# Patient Record
Sex: Female | Born: 1975 | Race: Black or African American | Hispanic: No | Marital: Single | Smoking: Never smoker
Health system: Southern US, Community
[De-identification: ages and names within clinical notes are randomized; demographics above are authoritative.]

## PROBLEM LIST (undated history)

## (undated) DIAGNOSIS — E079 Disorder of thyroid, unspecified: Secondary | ICD-10-CM

## (undated) DIAGNOSIS — B259 Cytomegaloviral disease, unspecified: Secondary | ICD-10-CM

## (undated) DIAGNOSIS — I219 Acute myocardial infarction, unspecified: Secondary | ICD-10-CM

## (undated) DIAGNOSIS — I469 Cardiac arrest, cause unspecified: Secondary | ICD-10-CM

## (undated) DIAGNOSIS — D649 Anemia, unspecified: Secondary | ICD-10-CM

## (undated) DIAGNOSIS — D219 Benign neoplasm of connective and other soft tissue, unspecified: Secondary | ICD-10-CM

## (undated) HISTORY — DX: Benign neoplasm of connective and other soft tissue, unspecified: D21.9

## (undated) HISTORY — DX: Acute myocardial infarction, unspecified: I21.9

## (undated) HISTORY — DX: Cardiac arrest, cause unspecified: I46.9

## (undated) HISTORY — DX: Anemia, unspecified: D64.9

## (undated) HISTORY — DX: Disorder of thyroid, unspecified: E07.9

---

## 2014-03-20 ENCOUNTER — Encounter (HOSPITAL_BASED_OUTPATIENT_CLINIC_OR_DEPARTMENT_OTHER): Payer: Self-pay | Admitting: *Deleted

## 2014-03-20 ENCOUNTER — Emergency Department (HOSPITAL_BASED_OUTPATIENT_CLINIC_OR_DEPARTMENT_OTHER)
Admission: EM | Admit: 2014-03-20 | Discharge: 2014-03-20 | Disposition: A | Discharge status: Home / Self Care | Payer: Self-pay | Attending: Emergency Medicine | Admitting: Emergency Medicine

## 2014-03-20 ENCOUNTER — Emergency Department (HOSPITAL_BASED_OUTPATIENT_CLINIC_OR_DEPARTMENT_OTHER): Payer: Self-pay

## 2014-03-20 DIAGNOSIS — R5383 Other fatigue: Secondary | ICD-10-CM | POA: Insufficient documentation

## 2014-03-20 DIAGNOSIS — Z8619 Personal history of other infectious and parasitic diseases: Secondary | ICD-10-CM | POA: Insufficient documentation

## 2014-03-20 DIAGNOSIS — R531 Weakness: Secondary | ICD-10-CM | POA: Insufficient documentation

## 2014-03-20 DIAGNOSIS — Z3202 Encounter for pregnancy test, result negative: Secondary | ICD-10-CM | POA: Insufficient documentation

## 2014-03-20 DIAGNOSIS — R51 Headache: Secondary | ICD-10-CM | POA: Insufficient documentation

## 2014-03-20 DIAGNOSIS — Z79899 Other long term (current) drug therapy: Secondary | ICD-10-CM | POA: Insufficient documentation

## 2014-03-20 HISTORY — DX: Cytomegaloviral disease, unspecified: B25.9

## 2014-03-20 LAB — CBC WITH DIFFERENTIAL/PLATELET
BASOS ABS: 0 10*3/uL (ref 0.0–0.1)
BASOS PCT: 1 % (ref 0–1)
Eosinophils Absolute: 0.1 10*3/uL (ref 0.0–0.7)
Eosinophils Relative: 3 % (ref 0–5)
HEMATOCRIT: 34.9 % — AB (ref 36.0–46.0)
Hemoglobin: 11.5 g/dL — ABNORMAL LOW (ref 12.0–15.0)
Lymphocytes Relative: 44 % (ref 12–46)
Lymphs Abs: 1.4 10*3/uL (ref 0.7–4.0)
MCH: 30.7 pg (ref 26.0–34.0)
MCHC: 33 g/dL (ref 30.0–36.0)
MCV: 93.3 fL (ref 78.0–100.0)
Monocytes Absolute: 0.4 10*3/uL (ref 0.1–1.0)
Monocytes Relative: 11 % (ref 3–12)
NEUTROS ABS: 1.3 10*3/uL — AB (ref 1.7–7.7)
NEUTROS PCT: 41 % — AB (ref 43–77)
Platelets: 169 10*3/uL (ref 150–400)
RBC: 3.74 MIL/uL — ABNORMAL LOW (ref 3.87–5.11)
RDW: 12.5 % (ref 11.5–15.5)
WBC: 3.1 10*3/uL — ABNORMAL LOW (ref 4.0–10.5)

## 2014-03-20 LAB — COMPREHENSIVE METABOLIC PANEL
ALBUMIN: 4.1 g/dL (ref 3.5–5.2)
ALK PHOS: 46 U/L (ref 39–117)
ALT: 12 U/L (ref 0–35)
AST: 22 U/L (ref 0–37)
Anion gap: 6 (ref 5–15)
BILIRUBIN TOTAL: 0.7 mg/dL (ref 0.3–1.2)
BUN: 10 mg/dL (ref 6–23)
CHLORIDE: 104 meq/L (ref 96–112)
CO2: 29 mmol/L (ref 19–32)
Calcium: 9.1 mg/dL (ref 8.4–10.5)
Creatinine, Ser: 0.83 mg/dL (ref 0.50–1.10)
GFR calc Af Amer: >90 mL/min (ref >90)
GFR, EST NON AFRICAN AMERICAN: 87 mL/min — AB (ref >90)
Glucose, Bld: 83 mg/dL (ref 70–99)
POTASSIUM: 4 mmol/L (ref 3.5–5.1)
SODIUM: 139 mmol/L (ref 135–145)
Total Protein: 6.9 g/dL (ref 6.0–8.3)

## 2014-03-20 LAB — URINALYSIS, ROUTINE W REFLEX MICROSCOPIC
BILIRUBIN URINE: NEGATIVE
GLUCOSE, UA: NEGATIVE mg/dL
HGB URINE DIPSTICK: NEGATIVE
KETONES UR: NEGATIVE mg/dL
Leukocytes, UA: NEGATIVE
Nitrite: NEGATIVE
PH: 7.5 (ref 5.0–8.0)
PROTEIN: NEGATIVE mg/dL
Specific Gravity, Urine: 1.013 (ref 1.005–1.030)
Urobilinogen, UA: 0.2 mg/dL (ref 0.0–1.0)

## 2014-03-20 LAB — T4, FREE: FREE T4: 1.07 ng/dL (ref 0.80–1.80)

## 2014-03-20 LAB — PREGNANCY, URINE: Preg Test, Ur: NEGATIVE

## 2014-03-20 LAB — TROPONIN I: Troponin I: <0.03 ng/mL (ref <0.031)

## 2014-03-20 LAB — TSH: TSH: 5.338 u[IU]/mL — ABNORMAL HIGH (ref 0.350–4.500)

## 2014-03-20 MED ORDER — SODIUM CHLORIDE 0.9 % IV BOLUS (SEPSIS)
1000.0000 mL | Freq: Once | INTRAVENOUS | Status: AC
  Administered 2014-03-20: 1000 mL via INTRAVENOUS

## 2014-03-20 MED ORDER — LEVOTHYROXINE SODIUM 25 MCG PO TABS: 25.0000 ug | ORAL_TABLET | Freq: Every day | ORAL | Status: DC

## 2014-03-20 NOTE — ED Provider Notes (Signed)
CSN: [id number]     Arrival date & time 03/20/14  3536 History  This chart was scribed for Ezequiel Essex, MD by Ludger Nutting, ED Scribe. This patient was seen in room MH01/MH01 and the patient's care was started 9:18 AM.    Chief Complaint  Patient presents with  . Fatigue   The history is provided by the patient. No language interpreter was used.     HPI Comments: Cristina Robertson is a 38 y.o. female who presents to the Emergency Department complaining of ongoing fatigue and head discomfort for the past several months. She reports neck pain currently. She states she moved to the [place] 1 [place] ago from [place] 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 She denies taking regular daily medications. She denies objective fever, HA, blurry or double vision, nausea, vomiting.   Past Medical History  Diagnosis Date  . CMV (cytomegalovirus infection)    History reviewed. No pertinent past surgical history. No family history on file. History  Substance Use Topics  . Smoking status: Never Smoker   . Smokeless tobacco: Not on file  . Alcohol Use: No   OB History    No data available     Review of Systems  A complete 10 system review of systems was obtained and all systems are negative except as noted in the HPI and PMH.    Allergies  Darvon  Home Medications   Prior to Admission medications   Medication Sig Start Date End Date Taking? Authorizing Provider  Multiple Vitamins-Minerals (MULTIVITAMIN WITH MINERALS) tablet Take 1 tablet by mouth daily.   Yes Historical Provider, MD  niacin 100 MG tablet Take 100 mg by mouth at bedtime.   Yes Historical Provider, MD  levothyroxine (SYNTHROID, LEVOTHROID) 25 MCG tablet Take 1 tablet (25 mcg total) by mouth daily before breakfast. 03/20/14   Ezequiel Essex, MD   BP 105/70 mmHg  Pulse 78  Temp(Src) 98.1 F (36.7 C) (Oral)  Resp  16  Ht 5\' 10"  (1.778 m)  SpO2 100%  LMP 03/13/2014 Physical Exam  Constitutional: She is oriented to person, place, and time. She appears well-developed and well-nourished. No distress.  HENT:  Head: Normocephalic and atraumatic.  Mouth/Throat: Oropharynx is clear and moist. No oropharyngeal exudate.  Eyes: Conjunctivae and EOM are normal. Pupils are equal, round, and reactive to light.  Neck: Normal range of motion. Neck supple.  No meningismus.  Cardiovascular: Normal rate, regular rhythm, normal heart sounds and intact distal pulses.   No murmur heard. Pulmonary/Chest: Effort normal and breath sounds normal. No respiratory distress.  Abdominal: Soft. There is no tenderness. There is no rebound and no guarding.  Musculoskeletal: Normal range of motion. She exhibits no edema or tenderness.  Neurological: She is alert and oriented to person, place, and time. No cranial nerve deficit. She exhibits normal muscle tone. Coordination normal.  No ataxia on finger to nose bilaterally. No pronator drift. 5/5 strength throughout. CN 2-12 intact. Negative Romberg. Equal grip strength. Sensation intact. Gait is normal.   Skin: Skin is warm.  Psychiatric: She has a normal mood and affect. Her behavior is normal.  Nursing note and vitals reviewed.   ED Course  Procedures (including critical care time)  DIAGNOSTIC STUDIES: Oxygen Saturation is 100% on RA, normal by my interpretation.    COORDINATION OF CARE: 9:27 AM Discussed treatment plan with pt at bedside  and pt agreed to plan.   Labs Review Labs Reviewed  CBC WITH DIFFERENTIAL - Abnormal; Notable for the following:    WBC 3.1 (*)    RBC 3.74 (*)    Hemoglobin 11.5 (*)    HCT 34.9 (*)    Neutrophils Relative % 41 (*)    Neutro Abs 1.3 (*)    All other components within normal limits  COMPREHENSIVE METABOLIC PANEL - Abnormal; Notable for the following:    GFR calc non Af Amer 87 (*)    All other components within normal limits  TSH  - Abnormal; Notable for the following:    TSH 5.338 (*)    All other components within normal limits  URINALYSIS, ROUTINE W REFLEX MICROSCOPIC  PREGNANCY, URINE  TROPONIN I  T4, FREE    Imaging Review Ct Head Wo Contrast  03/20/2014   CLINICAL DATA:  Headache and altered mental status. Sinus congestion  EXAM: CT HEAD WITHOUT CONTRAST  TECHNIQUE: Contiguous axial images were obtained from the base of the skull through the vertex without intravenous contrast.  COMPARISON:  None.  FINDINGS: The ventricles are normal in size and configuration. The right lateral ventricle is slightly larger than the left lateral ventricle, an anatomic variant. There is no intracranial mass, hemorrhage, extra-axial fluid collection, or midline shift. Gray-white compartments are normal. No acute infarct is apparent.  The bony calvarium appears intact. The mastoid air cells are clear. There is focal ossification of the anterior falx, a benign finding.  IMPRESSION: No intracranial mass or hemorrhage. Gray-white compartments are normal. No finding suggesting acute infarct.   Electronically Signed   By: Lowella Grip M.D.   On: 03/20/2014 10:43     EKG Interpretation   Date/Time:  Tuesday March 20 2014 09:48:07 EST Ventricular Rate:  68 PR Interval:  122 QRS Duration: 92 QT Interval:  388 QTC Calculation: 412 R Axis:   61 Text Interpretation:  Normal sinus rhythm Cannot rule out Anterior infarct  , age undetermined Abnormal ECG No previous ECGs available Confirmed by  Walther Sanagustin  MD, Lateisha Thurlow [phone]) on 03/20/2014 10:03:33 AM      MDM   Final diagnoses:  Weakness  Other fatigue   several weeks of fatigue, lightheadedness, generalized weakness. Recent travel from [place] Denies any fever, chills, nausea vomiting. No headache.  Neuro exam is nonfocal. No meningismus.  Labs unremarkable. TSH 5.3.  FT4 pending Patient needs follow-up with PCP in the area. Referrals given. She plans to stay  in the [place] for several months.  Low dose synthroid started.  Return precautions discussed  BP 105/70 mmHg  Pulse 78  Temp(Src) 98.1 F (36.7 C) (Oral)  Resp 16  Ht 5\' 10"  (1.778 m)  SpO2 100%  LMP 03/13/2014   I personally performed the services described in this documentation, which was scribed in my presence. The recorded information has been reviewed and is accurate.   Ezequiel Essex, MD 03/20/14 [phone]

## 2014-03-20 NOTE — Discharge Instructions (Signed)
Fatigue Your thyroid test is pending. Choose a doctor from the attached list. Return to the ED if you develop new or worsening symptoms. Fatigue is a feeling of tiredness, lack of energy, lack of motivation, or feeling tired all the time. Having enough rest, good nutrition, and reducing stress will normally reduce fatigue. Consult your caregiver if it persists. The nature of your fatigue will help your caregiver to find out its cause. The treatment is based on the cause.  CAUSES  There are many causes for fatigue. Most of the time, fatigue can be traced to one or more of your habits or routines. Most causes fit into one or more of three general areas. They are: Lifestyle problems  Sleep disturbances.  Overwork.  Physical exertion.  Unhealthy habits.  Poor eating habits or eating disorders.  Alcohol and/or drug use .  Lack of proper nutrition (malnutrition). Psychological problems  Stress and/or anxiety problems.  Depression.  Grief.  Boredom. Medical Problems or Conditions  Anemia.  Pregnancy.  Thyroid gland problems.  Recovery from major surgery.  Continuous pain.  Emphysema or asthma that is not well controlled  Allergic conditions.  Diabetes.  Infections (such as mononucleosis).  Obesity.  Sleep disorders, such as sleep apnea.  Heart failure or other heart-related problems.  Cancer.  Kidney disease.  Liver disease.  Effects of certain medicines such as antihistamines, cough and cold remedies, prescription pain medicines, heart and blood pressure medicines, drugs used for treatment of cancer, and some antidepressants. SYMPTOMS  The symptoms of fatigue include:   Lack of energy.  Lack of drive (motivation).  Drowsiness.  Feeling of indifference to the surroundings. DIAGNOSIS  The details of how you feel help guide your caregiver in finding out what is causing the fatigue. You will be asked about your present and past health condition. It is  important to review all medicines that you take, including prescription and non-prescription items. A thorough exam will be done. You will be questioned about your feelings, habits, and normal lifestyle. Your caregiver may suggest blood tests, urine tests, or other tests to look for common medical causes of fatigue.  TREATMENT  Fatigue is treated by correcting the underlying cause. For example, if you have continuous pain or depression, treating these causes will improve how you feel. Similarly, adjusting the dose of certain medicines will help in reducing fatigue.  HOME CARE INSTRUCTIONS   Try to get the required amount of good sleep every night.  Eat a healthy and nutritious diet, and drink enough water throughout the day.  Practice ways of relaxing (including yoga or meditation).  Exercise regularly.  Make plans to change situations that cause stress. Act on those plans so that stresses decrease over time. Keep your work and personal routine reasonable.  Avoid street drugs and minimize use of alcohol.  Start taking a daily multivitamin after consulting your caregiver. SEEK MEDICAL CARE IF:   You have persistent tiredness, which cannot be accounted for.  You have fever.  You have unintentional weight loss.  You have headaches.  You have disturbed sleep throughout the night.  You are feeling sad.  You have constipation.  You have dry skin.  You have gained weight.  You are taking any new or different medicines that you suspect are causing fatigue.  You are unable to sleep at night.  You develop any unusual swelling of your legs or other parts of your body. SEEK IMMEDIATE MEDICAL CARE IF:   You are feeling confused.  Your vision is blurred.  You feel faint or pass out.  You develop severe headache.  You develop severe abdominal, pelvic, or back pain.  You develop chest pain, shortness of breath, or an irregular or fast heartbeat.  You are unable to pass a  normal amount of urine.  You develop abnormal bleeding such as bleeding from the rectum or you vomit blood.  You have thoughts about harming yourself or committing suicide.  You are worried that you might harm someone else. MAKE SURE YOU:   Understand these instructions.  Will watch your condition.  Will get help right away if you are not doing well or get worse. Document Released: 01/11/2007 Document Revised: 06/08/2011 Document Reviewed: 07/18/2013 [facility] Patient Information 2015 [place], [place]. This information is not intended to replace advice given to you by your health care provider. Make sure you discuss any questions you have with your health care provider.    Emergency Department Resource Guide 1) Find a Doctor and Pay Out of Pocket Although you won't have to find out who is covered by your insurance plan, it is a good idea to ask around and get recommendations. You will then need to call the office and see if the doctor you have chosen will accept you as a new patient and what types of options they offer for patients who are self-pay. Some doctors offer discounts or will set up payment plans for their patients who do not have insurance, but you will need to ask so you aren't surprised when you get to your appointment.  2) Contact Your Local Health Department Not all health departments have doctors that can see patients for sick visits, but many do, so it is worth a call to see if yours does. If you don't know where your local health department is, you can check in your phone book. The CDC also has a tool to help you locate your state's health department, and many state websites also have listings of all of their local health departments.  3) Find a [facility] If your illness is not likely to be very severe or complicated, you may want to try a walk in clinic. These are popping up all over the country in pharmacies, drugstores, and shopping centers. They're usually staffed  by nurse practitioners or physician assistants that have been trained to treat common illnesses and complaints. They're usually fairly quick and inexpensive. However, if you have serious medical issues or chronic medical problems, these are probably not your best option.  No Primary Care Doctor: - Call Health Connect at  [phone] - they can help you locate a primary care doctor that  accepts your insurance, provides certain services, etc. - Physician Referral Service- [phone]  Chronic Pain Problems: Organization         Address  Phone   Notes  [facility]  [phone] Patients need to be referred by their primary care doctor.   Medication Assistance: Organization         Address  Phone   Notes  [facility] Medication [facility] [address] [place], [place] [address] [phone] --Must be a resident of [facility] -- Must have NO insurance coverage whatsoever (no Medicaid/ Medicare, etc.) -- The pt. MUST have a primary care doctor that directs their care regularly and follows them in the community   Med[facility]ist  [phone]   [employer]  [phone]    Agencies that provide inexpensive  medical care: Organization         Address  Phone   Notes  [facility]  [phone]   [facility]    [phone]   [facility] [place], [place] [address] [phone]   [place] [address] [place] [phone]   Planned Parenthood    [phone]   [facility]    [phone]   [place] and [place] [address], [place] Phone:  [phone], Fax:  [phone] Hours of Operation:  9 am - 6 pm, M-F.  Also accepts Medicaid/Medicare and self-pay.  [facility] for [place] [facility] [address], [address], [place] Phone: [phone], Fax: [phone]. Hours of Operation:   8:30 am - 5:30 pm, M-F.  Also accepts Medicaid and self-pay.  [facility] [place] [address] [place] Phone: [phone]   [place], [place], [place] [phone], Ext. [phone] Mondays & Thursdays: 7-9 AM.  First 15 patients are seen on a first come, first serve basis.    [place] Providers:  Organization         Address  Phone   Notes  [facility] [address], [place] [phone] Also accepts self-pay patients.  [facility] [address], [place]  [phone]   [place], [address], [place] [phone]   [facility] [address] [place] [phone]   Lucianne Lei [address], [place]   [phone] Only accepts [place] Access [place] patients after they have their name applied to their card.   Self-Pay (no insurance) in [facility]:  Organization         Address  Phone   Notes  Sickle Cell Patients, [facility] [place] [phone]   [facility] [place] [phone]   [facility] [place]  [place], [place], [place] [phone]   [facility]/Dr. Osei-Bonsu  [address] [place] or [address], [place] [phone] Phone number for both [place] and [place] locations is the same.  Urgent Medical and [facility] Day Surgery [address], [place] [phone]   [facility] [address] [place] or [address] [phone]   [facility] [address], [place] [phone], phone; [phone], fax Sees patients 1st and 3rd Saturday of every month.  Must not qualify for public or private insurance (i.e. Medicaid, Medicare, [facility] Health  Choice, Veterans' Benefits)  Household income should be no more than 200% of the poverty level The clinic cannot treat you if you are pregnant or think you are pregnant  Sexually transmitted diseases are not treated at the clinic.    Dental Care: Organization         Address  Phone  Notes  [facility] Department of [facility] [place] [phone] Accepts children up to age 82 who are enrolled in [place] or [place]; pregnant women with a Medicaid card; and children who have applied for Medicaid or [place] Health Choice, but  were declined, whose parents can pay a reduced fee at time of service.  [facility] Department of [facility]  [address], [place] [phone] Accepts children up to age 55 who are enrolled in [place] or [place]; pregnant women with a Medicaid card; and children who have applied for Medicaid or [place] Health Choice, but were declined, whose parents can pay a reduced fee at time of service.  [place] Adult [facility] Access PROGRAM  [place] [phone] Patients are seen by appointment only. Walk-ins are not accepted. [place] will see patients 50 years of age and older. Monday - Tuesday (8am-5pm) Most Wednesdays (8:30-5pm) $30 per visit, cash only  [facility] Adult Dental Access PROGRAM  [address], [facility] [phone] Patients are seen by appointment only. Walk-ins are not accepted. [place] will see patients 49 years of age and older. One Wednesday Evening (Monthly: Volunteer Based).  $30 per visit, cash only  [place]  [phone] for adults; Children under age 76, call Graduate Pediatric Dentistry at [phone]. Children aged 72-14, please call [phone] to request a pediatric application.  Dental services are provided in all areas of dental care including fillings, crowns and bridges, complete  and partial dentures, implants, gum treatment, root canals, and extractions. Preventive care is also provided. Treatment is provided to both adults and children. Patients are selected via a lottery and there is often a waiting list.   [facility] [address], [place]  [phone] [email or web address]   [facility] [address] [employer], [place] [phone], Ext. [phone] Second and Fourth Thursday of each month, opens at 6:30 AM; Clinic ends at 9 AM.  Patients are seen on a first-come first-served basis, and a limited number are seen during each clinic.   [facility]  [address] Hillard Danker [place], [place] [phone]   Eligibility Requirements You must have lived in [place], [place], or [place] counties for at least the last three months.   You cannot be eligible for state or federal sponsored [employer], including [employer], [place], or [employer].   You generally cannot be eligible for healthcare insurance through your employer.    How to apply: Eligibility screenings are held every Tuesday and Wednesday afternoon from 1:00 pm until 4:00 pm. You do not need an appointment for the interview!  [facility] [address], [place], [place]   [place]  [place] Department  [place]  [phone]    Behavioral Health Resources in the Community: Intensive Outpatient Programs Organization         Address  Phone  Notes  [place]. [address], [place], [place] [phone]   [facility] Outpatient [address] [place], [place]   ADS: Alcohol & Drug Svcs [address] [place], [place]   [place] [address]  [place], [place] or [phone]   Substance Abuse Resources Organization          Address  Phone  Notes  Alcohol and Drug Services  [phone]   [facility]  [phone]   [facility] [place]  [phone]   Chinita Pester  [phone]   Residential & Outpatient Substance Abuse Program  [phone]   Psychological Services  Organization         Address  Phone  Notes  [place]  [place]  [phone]   [place] [address], [place] or [phone]    Mobile Crisis Teams Organization         Address  Phone  Notes  [facility], Mobile Crisis Care Unit  [phone]   [facility]  [address] [place], [place]   [place] Levels [address] [place] [phone]    Self-Help/Support Groups Organization         Address  Phone             Notes  [place]. of [place] - variety of support groups  [place] Call for more information  Narcotics Anonymous (NA), [facility] [address], [employer] [place]  2 meetings at this location   Special educational needs teacher         Address  Phone  Notes  ASAP Residential Treatment [place],    [place]  [phone]   [facility]  [address] [place] [id number], [place], [place]   [place], [place] [phone] Admissions: 8am-3pm M-F  Incentives Substance [place] [address],    [place], [place] [phone]   [facility] [address] [place], [place], [place]   [facility] [address],  [place], [place]   [facility]ight Programs - Intensive Outpatient [address], [place], [place]   [facility] ([place].,  [place], [place] [phone] or [phone]   [facility] Services (RTS) [address] [place], [place] Accepts Medicaid  [facility] [place] [address],  [place] [phone] Substance Abuse/Addiction Treatment   [facility] Organization         Address  Phone  Notes  [facility]  [phone]   Domenic Schwab, PhD [address] Arlis Porta [place], [place]   [phone] or [phone]   [place], [place] [phone]   [facility] [address], [place], [place] [phone] Insurance/Medicaid/sponsorship through [facility] and Families [address], [address] [place]                                    [place], [place] [phone] [place] [address], [place] [phone]    Dr. Adele Schilder  [phone]   [facility] Dept. 1) [address] [place] 2) [place] 3)  [place] [address], [place] [phone]  [phone]   [place] [phone] or [phone] (After Hours)

## 2014-03-20 NOTE — ED Notes (Signed)
Pt with multiple c/o of weakness, fatigue, left side HA, knees wobbly, ulcer in mouth, pain in toes, legs and back. Pt sts complaints are intermittent.

## 2014-03-20 NOTE — ED Notes (Signed)
Patient ambulates around RN station without difficulty or assistance.  Patient given iced water for oral challenge.

## 2019-10-13 ENCOUNTER — Encounter: Payer: 59 | Admitting: Obstetrics and Gynecology

## 2019-10-18 ENCOUNTER — Other Ambulatory Visit: Payer: Self-pay

## 2019-10-18 ENCOUNTER — Ambulatory Visit (INDEPENDENT_AMBULATORY_CARE_PROVIDER_SITE_OTHER): Payer: 59 | Admitting: Obstetrics and Gynecology

## 2019-10-18 ENCOUNTER — Encounter: Payer: Self-pay | Admitting: Obstetrics and Gynecology

## 2019-10-18 VITALS — BP 128/82 | Ht 70.5 in | Wt 183.0 lb

## 2019-10-18 DIAGNOSIS — N939 Abnormal uterine and vaginal bleeding, unspecified: Secondary | ICD-10-CM

## 2019-10-18 MED ORDER — LUPRON DEPOT (3-MONTH) 11.25 MG IM KIT: 11.2500 mg | PACK | INTRAMUSCULAR | 1 refills | Status: DC

## 2019-10-18 NOTE — Progress Notes (Signed)
Gynecology Abnormal Uterine Bleeding Initial Evaluation   Chief Complaint:  Chief Complaint  Patient presents with  . Consult    History of Present Illness:    Paitient is a 44 y.o. G1P0010 who LMP was Patient's last menstrual period was 10/03/2019., presents today for consultation for management of uterine fibroids.  The patient has imaging studies, pap, as well as endometrial biopsies previously completed at [facility] by Dr. Dennie Fetters.  She does report mass effect symptoms as well as heavy menstrual cycles.  Work up also revealed hypothyroidism with patient being started on synthroid as a result.  Previous evaluation:   Imaging Studies   Ultrasound 10/17/2019 Grossly enlarged uterus with multiple fibroids (11 measures) largest fundal 12.61 x 7.77 x 10/74cm   CT A/P 06/29/2019 There is a large lobulated solid 14.9 x 9.4 x 14.4 cm  mass occupying much of the pelvis, with scattered calcifications in the left posterior  inferior portion of the mass.  Left hydronephrosis is also noted.     Labs   Pap 07/07/2019 NILM HPV negative   Endometrial Biopsy 07/07/2019 Proliferative phase endometrium, negative for hyperplasia or malignancy   CBC 4/7/2021H&H 10.2 g/dL & 32.6%  Prior Diagnosis: uterine fibroids. Previous Treatment: norethindrone   Review of Systems: Review of Systems  Constitutional: Negative.   Gastrointestinal: Positive for abdominal pain and constipation. Negative for blood in stool, diarrhea, heartburn, melena, nausea and vomiting.  Genitourinary: Positive for frequency. Negative for dysuria, flank pain, hematuria and urgency.  Endo/Heme/Allergies: Does not bruise/bleed easily.    Past Medical History:  There are no problems to display for this patient.   Past Surgical History:  History reviewed. No pertinent surgical history.  Obstetric History: G1P0010  Family History:  History reviewed. No pertinent family history.  Social History:  Social History     Socioeconomic History  . Marital status: Single    Spouse name: Not on file  . Number of children: Not on file  . Years of education: Not on file  . Highest education level: Not on file  Occupational History  . Not on file  Tobacco Use  . Smoking status: Never Smoker  . Smokeless tobacco: Never Used  Vaping Use  . Vaping Use: Never used  Substance and Sexual Activity  . Alcohol use: No  . Drug use: No  . Sexual activity: Not Currently    Birth control/protection: None  Other Topics Concern  . Not on file  Social History Narrative  . Not on file   Social Determinants of Health   Financial Resource Strain:   . Difficulty of Paying Living Expenses:   Food Insecurity:   . Worried About Charity fundraiser in the Last Year:   . Arboriculturist in the Last Year:   Transportation Needs:   . Film/video editor (Medical):   Marland Kitchen Lack of Transportation (Non-Medical):   Physical Activity:   . Days of Exercise per Week:   . Minutes of Exercise per Session:   Stress:   . Feeling of Stress :   Social Connections:   . Frequency of Communication with Friends and Family:   . Frequency of Social Gatherings with Friends and Family:   . Attends Religious Services:   . Active Member of Clubs or Organizations:   . Attends Archivist Meetings:   Marland Kitchen Marital Status:   Intimate Partner Violence:   . Fear of Current or Ex-Partner:   . Emotionally  Abused:   Marland Kitchen Physically Abused:   . Sexually Abused:     Allergies:  Allergies  Allergen Reactions  . Darvon [Propoxyphene] Other (See Comments)    hallucinate    Medications: Prior to Admission medications   Medication Sig Start Date End Date Taking? Authorizing Provider  levothyroxine (SYNTHROID, LEVOTHROID) 25 MCG tablet Take 1 tablet (25 mcg total) by mouth daily before breakfast. 03/20/14  Yes Rancour, Annie Main, MD  leuprolide (LUPRON DEPOT, 59-MONTH,) 11.25 MG injection Inject 11.25 mg into the muscle every 3 (three)  months. 10/18/19   Malachy Mood, MD  Multiple Vitamins-Minerals (MULTIVITAMIN WITH MINERALS) tablet Take 1 tablet by mouth daily. Patient not taking: Reported on 10/18/2019    [provider]  niacin 100 MG tablet Take 100 mg by mouth at bedtime.    [provider]    Physical Exam Blood pressure 128/82, height 5' 10.5" (1.791 m), weight 183 lb (83 kg), last menstrual period 10/03/2019.  Patient's last menstrual period was 10/03/2019.  General: NAD, well nourished appears stated age 20: normocephalic, anicteric Pulmonary: No increased work of breathing Abdomen: NABS, soft, non-tender, non-distended.  Umbilicus without lesions.  No hepatomegaly or splenomegaly.  There is an enlarged abdominal mass extending to just above the umbilicus on the patient's left.  The mass is mobile, non-tender. No evidence of hernia  Extremities: no edema, erythema, or tenderness Neurologic: Grossly intact Psychiatric: mood appropriate, affect full  Female chaperone present for pelvic portions of the physical exam  Assessment: 44 y.o. G1P0010 with AUB-L  Plan: Problem List Items Addressed This Visit    None      1) Discussed management options for AUB-L.  Given the size of her fibroids, as well as symptomatic mass effect (pelvic pressure, urinary frequency, constipation, hydronephrosis on imaging) I concur that surgical management remains the patient's best option.  Given the current size of the uterus this would require a midline laparotomy.  The patient is most interested in a minimally invasive approach.  I discussed depo lupron and uterine artery embolization as options for decreasing the overall size of her fibroids.  However, depending on the degree of response laparoscopy may or may not be feasible, and that intraoperative findings may necessitate the need to convert to laparotomy.  However, if significant decrease in size of the uterus this also opens up the possibility of a  Pfannenstiel approach.  She has not had any prior deliveries, so removal would require either vaginal morcellation vs in bag morcellation abdominally.   - Start lupron - Referral triangle vascular [place] preoperatively - Laparoscopic approoach if sufficient shrinking vs pfanennstiehl if converted to open if appropriate size decrease  2) A total of 30 minutes were spent in face-to-face contact with the patient during this encounter with over half of that time devoted to counseling and coordination of care, prior labs and imaging studies were reviewed by myself.  3) Return in about 5 months (around 03/19/2020), or return once lupron approved for first injection, for Surgery planning.   Malachy Mood, MD, [facility], [facility] 10/18/2019, 1:39 PM

## 2019-11-09 ENCOUNTER — Telehealth: Payer: Self-pay | Admitting: Obstetrics and Gynecology

## 2019-11-09 NOTE — Telephone Encounter (Signed)
I rec'd a call from pt asking about scheduling her uterine embolization. I looked through the notes from her 10/18/19 appt with Staebler.   I asked about Lupron and she adv that her pharmacy did not have but when let her know when it's available.  I adv that AMS noted to rtn to clinic around 03/19/20. She was hoping that her return would be in November with a scheduled procedure in December for insurance deductible reasons.  I adv that I would adv AMS of her call and questions.

## 2019-11-13 NOTE — Telephone Encounter (Signed)
PA request for Lupron Depot received from pharmacy. Please let me know if this needs to be done, or is pt going th be scheduled for surgery. KJ CMA

## 2019-11-14 NOTE — Telephone Encounter (Signed)
Does this pt need an appointment with AMS to discuss surgery? If so,please let Judson Roch know.

## 2019-11-14 NOTE — Telephone Encounter (Signed)
Yes can do lupron as well

## 2019-11-15 ENCOUNTER — Telehealth: Payer: Self-pay

## 2019-11-15 NOTE — Telephone Encounter (Signed)
PA has been started through [facility] for Lupron Depot. I will update pt on status as I am updated

## 2019-11-27 ENCOUNTER — Other Ambulatory Visit: Payer: Self-pay

## 2019-11-27 MED ORDER — LUPRON DEPOT (3-MONTH) 11.25 MG IM KIT: 11.2500 mg | PACK | INTRAMUSCULAR | 1 refills | Status: DC

## 2019-11-27 NOTE — Telephone Encounter (Signed)
Fax received from pt's Rx benefits ([facility]) Lupron depot has been approved. Approval dates are: 11/26/2019-05/24/2009.  Pharmacy and pt is aware.

## 2019-12-18 ENCOUNTER — Other Ambulatory Visit: Payer: Self-pay

## 2019-12-18 MED ORDER — LUPRON DEPOT (3-MONTH) 11.25 MG IM KIT: 11.2500 mg | PACK | INTRAMUSCULAR | 1 refills | Status: DC

## 2020-01-22 ENCOUNTER — Telehealth: Payer: Self-pay

## 2020-01-22 NOTE — Telephone Encounter (Signed)
Pt calling; hasn't received rx to shrink fibroids; is having surgery in Dec.  [phone]

## 2020-01-29 NOTE — Telephone Encounter (Signed)
Cristina Robertson will call to give verbal to Specialty Pharmacy.

## 2020-01-29 NOTE — Telephone Encounter (Signed)
LMVM to notify patient of rx status and that [facility] will be contacting her.

## 2020-01-29 NOTE — Telephone Encounter (Signed)
[facility] [place] [place] not [facility] the rx. Will have [facility] resend to them.

## 2020-01-29 NOTE — Telephone Encounter (Signed)
[facility] [place] as pt insurance requires it to be filled by [facility] [phone].

## 2020-01-29 NOTE — Telephone Encounter (Signed)
Spoke w/[facility] to check status of rx. Representative states they do not have a profile for this patient. All pertinent information given, profile for patient was created. I was transferred to the pharmacist to give verbal order of Lupron Depot 11.25mg  inj IM q3 mos #1 w/1 rf. They made the request STAT and will contact the patient to verify copay/coinsurance and authorize shipment, then will call us back to schedule shipment.

## 2020-01-29 NOTE — Telephone Encounter (Signed)
Patient transferred by front desk. She has not received the vm yet. Information relayed. Patient states she is scheduled to go out of the country on Wed and will be gone for a month. We discussed her concern about whether it would be effective prior to having her scheduled surgery in December. Advised that if she can speak to the pharmacy today, it's possible they could get it shipped overnight and she could receive it tomorrow prior to going out of country. She also is weighing the option of postponing surgery until next year. I gave her the # to contact [facility] to discuss and decide depending on how soon they can get the meds here. She will call back to let me know what she decides.

## 2020-01-29 NOTE — Telephone Encounter (Signed)
Per pharmacist at [place], [facility] [place], [place]. [phone] in July, August and September.

## 2020-02-14 NOTE — Telephone Encounter (Signed)
Pt calling to see if it's possible to have rx for med to shrink fibroids emailed to a pharm where she is out of the country.  Her email is [email or web address]    # [phone] if we can do an international call.

## 2020-02-14 NOTE — Telephone Encounter (Signed)
Cristina Robertson, Crystal11:48 AMwe can't email rx's. It can be sent electronically, but her insurance is the one who required the rx to be filled by [facility], so unless [facility] can mail it to her out of the country the answer is NO.

## 2020-03-08 ENCOUNTER — Telehealth: Payer: Self-pay | Admitting: Obstetrics and Gynecology

## 2020-03-08 ENCOUNTER — Other Ambulatory Visit: Payer: Self-pay | Admitting: Obstetrics and Gynecology

## 2020-03-08 MED ORDER — LUPRON DEPOT (3-MONTH) 11.25 MG IM KIT: 11.2500 mg | PACK | INTRAMUSCULAR | 1 refills | Status: DC

## 2020-03-08 NOTE — Telephone Encounter (Signed)
Rec'd call from pt stating that she just returned to the country yesterday and wanted to know about the [place] she was to have before year end.  I read through chart and saw last communication between pt and Crystal. It appears that her insurance was allowing for the Lupron Rx but that it had to be filled through a specialty pharmacy, [facility]. Crystal talked with pharmacy per chart notes and they were to be contacting pt. Pt, while out of the country, contacted office again and asked if the Rx could be sent elsewhere. She was told that per her ins the Rx had to go through [facility] and they would have to over night it to her.   She states she has not heard from them.  She did adv that she's been taking the following in hopes of shrinking her fibroids: [place] 3  She would like to know if Staebler wants to do u/s to see if there has been change or what the next step is in order for her to prepare for surgery.  I adv that I would inform Steabler of her call.

## 2020-03-08 NOTE — Telephone Encounter (Signed)
So this medicine will take 6 months to shrink the fibroid.  If we do not do the 6 months then it will need to be an open case (laparotomy) with at least  a 2 day postoperative stay  Patient more interested in doing minimally invasive approach.

## 2020-03-11 NOTE — Telephone Encounter (Signed)
Patient following up in regards to Ultrasound scheduling. She would like to have an u/s prior to the end of the year to see if what she has been taking has helped with shrinking the fibroids. [phone]

## 2020-03-12 ENCOUNTER — Other Ambulatory Visit: Payer: Self-pay | Admitting: Obstetrics and Gynecology

## 2020-03-12 DIAGNOSIS — D252 Subserosal leiomyoma of uterus: Secondary | ICD-10-CM

## 2020-03-12 DIAGNOSIS — D251 Intramural leiomyoma of uterus: Secondary | ICD-10-CM

## 2020-03-12 NOTE — Telephone Encounter (Signed)
So the none of the OTC stuff that she has taken would have any benefit in shrinking the fibroids so really no clinical indication to re-image prior to the Lupron

## 2020-03-12 NOTE — Telephone Encounter (Signed)
Patient notified. She feels like they have shrunk and wants an ultrasound. Requested I resend message to Dr. Georgianne Fick.

## 2020-03-12 NOTE — Progress Notes (Signed)
ct 

## 2020-03-12 NOTE — Telephone Encounter (Signed)
I put in an order it will be at the hospital I'll call with results but this study has 0 clinical significance.

## 2020-03-18 NOTE — Telephone Encounter (Signed)
[facility] calling to schedule shipment of Lupron Depot. [phone]

## 2020-03-18 NOTE — Telephone Encounter (Signed)
Spoke w/[facility]. Shipment scheduled for delivery Thursday 03/21/20.

## 2020-03-20 ENCOUNTER — Ambulatory Visit
Admission: RE | Admit: 2020-03-20 | Discharge: 2020-03-20 | Disposition: A | Discharge status: Home / Self Care | Payer: 59 | Source: Ambulatory Visit | Attending: Obstetrics and Gynecology | Admitting: Obstetrics and Gynecology

## 2020-03-20 ENCOUNTER — Other Ambulatory Visit: Payer: Self-pay | Admitting: Obstetrics and Gynecology

## 2020-03-20 ENCOUNTER — Ambulatory Visit: Payer: 59

## 2020-03-20 ENCOUNTER — Other Ambulatory Visit: Payer: Self-pay

## 2020-03-20 DIAGNOSIS — R102 Pelvic and perineal pain: Secondary | ICD-10-CM | POA: Diagnosis present

## 2020-03-20 DIAGNOSIS — D252 Subserosal leiomyoma of uterus: Secondary | ICD-10-CM | POA: Insufficient documentation

## 2020-03-20 DIAGNOSIS — D251 Intramural leiomyoma of uterus: Secondary | ICD-10-CM

## 2020-03-21 NOTE — Telephone Encounter (Addendum)
Lupron Depot received. Patient notified. She apt Monday 03/25/20@11am  for administration.

## 2020-03-25 ENCOUNTER — Ambulatory Visit (INDEPENDENT_AMBULATORY_CARE_PROVIDER_SITE_OTHER): Payer: 59

## 2020-03-25 ENCOUNTER — Other Ambulatory Visit: Payer: Self-pay

## 2020-03-25 DIAGNOSIS — D259 Leiomyoma of uterus, unspecified: Secondary | ICD-10-CM | POA: Diagnosis not present

## 2020-03-25 MED ORDER — LEUPROLIDE ACETATE 3.75 MG IM KIT
3.7500 mg | PACK | Freq: Once | INTRAMUSCULAR | Status: AC
  Administered 2020-03-25: 3.75 mg via INTRAMUSCULAR

## 2020-04-17 ENCOUNTER — Telehealth: Payer: Self-pay

## 2020-04-17 NOTE — Telephone Encounter (Signed)
Patient received Lupron Depot. Reports she has been feeling really achy, bones & joints hurt, exhausted. States she doesn't go anywhere to have Covid. She had a bad headache the other day that did eventually go away. Inquiring if side effect from Lupron and if she needs to be seen. [phone]

## 2020-04-17 NOTE — Telephone Encounter (Signed)
Spoke w/patient. She does go out to grocery store masked. Advised still could be covid. Recommend patient being tested. Lupron side effects given:hot flashes/sweats, headache/migraine, decreased libido, depression/emotional lability, dizziness, nausea/vomiting, pain, vaginitis, and weight gain. Patient will contact her primary about Covid testing.

## 2020-04-18 NOTE — Telephone Encounter (Signed)
Agree with testing given the prevalence of covid right now

## 2020-06-11 ENCOUNTER — Telehealth: Payer: Self-pay | Admitting: *Deleted

## 2020-06-11 NOTE — Telephone Encounter (Signed)
Per referral [place], NP 06/06/20 - called patient lvm of upcoming appointments - view mychart - mailed calendar with welcome packet

## 2020-06-17 ENCOUNTER — Ambulatory Visit: Payer: 59

## 2020-06-26 ENCOUNTER — Telehealth: Payer: Self-pay

## 2020-06-26 NOTE — Telephone Encounter (Signed)
Pt called back to say [facility] is working on rx and will call pt and [place] for shipping date and appt

## 2020-06-26 NOTE — Telephone Encounter (Signed)
Pt called to see if we had recv'd her lupron depot.  [phone]  Pt states her insurance has changed and the pharmacy is now [facility] instead of [facility] but she doesn't have their number.  Number was looked up online for [place].  Adv pt to call and check on status of order.

## 2020-07-01 ENCOUNTER — Other Ambulatory Visit: Payer: Self-pay | Admitting: Family

## 2020-07-01 DIAGNOSIS — D649 Anemia, unspecified: Secondary | ICD-10-CM

## 2020-07-02 ENCOUNTER — Inpatient Hospital Stay: Payer: 59 | Attending: Hematology & Oncology

## 2020-07-02 ENCOUNTER — Encounter: Payer: Self-pay | Admitting: Family

## 2020-07-02 ENCOUNTER — Other Ambulatory Visit: Payer: Self-pay

## 2020-07-02 ENCOUNTER — Inpatient Hospital Stay (HOSPITAL_BASED_OUTPATIENT_CLINIC_OR_DEPARTMENT_OTHER): Payer: 59 | Admitting: Family

## 2020-07-02 VITALS — BP 118/72 | HR 93 | Temp 98.2°F | Resp 18 | Ht 70.5 in | Wt 190.8 lb

## 2020-07-02 DIAGNOSIS — B259 Cytomegaloviral disease, unspecified: Secondary | ICD-10-CM | POA: Insufficient documentation

## 2020-07-02 DIAGNOSIS — D259 Leiomyoma of uterus, unspecified: Secondary | ICD-10-CM | POA: Diagnosis not present

## 2020-07-02 DIAGNOSIS — R55 Syncope and collapse: Secondary | ICD-10-CM | POA: Diagnosis not present

## 2020-07-02 DIAGNOSIS — N920 Excessive and frequent menstruation with regular cycle: Secondary | ICD-10-CM | POA: Insufficient documentation

## 2020-07-02 DIAGNOSIS — D500 Iron deficiency anemia secondary to blood loss (chronic): Secondary | ICD-10-CM | POA: Insufficient documentation

## 2020-07-02 DIAGNOSIS — D649 Anemia, unspecified: Secondary | ICD-10-CM

## 2020-07-02 DIAGNOSIS — Z79899 Other long term (current) drug therapy: Secondary | ICD-10-CM | POA: Diagnosis not present

## 2020-07-02 DIAGNOSIS — D509 Iron deficiency anemia, unspecified: Secondary | ICD-10-CM

## 2020-07-02 DIAGNOSIS — R5383 Other fatigue: Secondary | ICD-10-CM | POA: Insufficient documentation

## 2020-07-02 LAB — CBC WITH DIFFERENTIAL (CANCER CENTER ONLY)
Abs Immature Granulocytes: 0.01 10*3/uL (ref 0.00–0.07)
Basophils Absolute: 0 10*3/uL (ref 0.0–0.1)
Basophils Relative: 1 %
Eosinophils Absolute: 0.1 10*3/uL (ref 0.0–0.5)
Eosinophils Relative: 2 %
HCT: 39.8 % (ref 36.0–46.0)
Hemoglobin: 12.9 g/dL (ref 12.0–15.0)
Immature Granulocytes: 0 %
Lymphocytes Relative: 35 %
Lymphs Abs: 1.4 10*3/uL (ref 0.7–4.0)
MCH: 27.7 pg (ref 26.0–34.0)
MCHC: 32.4 g/dL (ref 30.0–36.0)
MCV: 85.4 fL (ref 80.0–100.0)
Monocytes Absolute: 0.4 10*3/uL (ref 0.1–1.0)
Monocytes Relative: 10 %
Neutro Abs: 2.1 10*3/uL (ref 1.7–7.7)
Neutrophils Relative %: 52 %
Platelet Count: 184 10*3/uL (ref 150–400)
RBC: 4.66 MIL/uL (ref 3.87–5.11)
RDW: 17.3 % — ABNORMAL HIGH (ref 11.5–15.5)
WBC Count: 4 10*3/uL (ref 4.0–10.5)
nRBC: 0 % (ref 0.0–0.2)

## 2020-07-02 LAB — CMP (CANCER CENTER ONLY)
ALT: 12 U/L (ref 0–44)
AST: 15 U/L (ref 15–41)
Albumin: 4.2 g/dL (ref 3.5–5.0)
Alkaline Phosphatase: 82 U/L (ref 38–126)
Anion gap: 7 (ref 5–15)
BUN: 17 mg/dL (ref 6–20)
CO2: 28 mmol/L (ref 22–32)
Calcium: 9.6 mg/dL (ref 8.9–10.3)
Chloride: 105 mmol/L (ref 98–111)
Creatinine: 0.91 mg/dL (ref 0.44–1.00)
GFR, Estimated: >60 mL/min (ref >60)
Glucose, Bld: 110 mg/dL — ABNORMAL HIGH (ref 70–99)
Potassium: 3.8 mmol/L (ref 3.5–5.1)
Sodium: 140 mmol/L (ref 135–145)
Total Bilirubin: 0.2 mg/dL — ABNORMAL LOW (ref 0.3–1.2)
Total Protein: 7.1 g/dL (ref 6.5–8.1)

## 2020-07-02 LAB — RETICULOCYTES
Immature Retic Fract: 7.4 % (ref 2.3–15.9)
RBC.: 4.6 MIL/uL (ref 3.87–5.11)
Retic Count, Absolute: 51.1 10*3/uL (ref 19.0–186.0)
Retic Ct Pct: 1.1 % (ref 0.4–3.1)

## 2020-07-02 LAB — LACTATE DEHYDROGENASE: LDH: 163 U/L (ref 98–192)

## 2020-07-02 LAB — SAVE SMEAR(SSMR), FOR PROVIDER SLIDE REVIEW

## 2020-07-02 NOTE — Progress Notes (Signed)
Hematology/Oncology Consultation   Name: Cristina Robertson      MRN: [id number]    Location: Room/bed info not found  Date: 07/02/2020 Time:3:06 PM   REFERRING PHYSICIAN: Marissa G. Hite, FNP  REASON FOR CONSULT: Iron deficiency anemia due to chronic blood loss   DIAGNOSIS: Iron deficiency anemia secondary to heavy cycle, now on Lupron Depot  HISTORY OF PRESENT ILLNESS: Cristina Robertson is a very pleasant 45 yo African American female with history of iron deficiency secondary to uterine fibroids and heavy cycle. She is now on Lupron Depot and has not had a cycle since January 2022.  No other blood loss noted. No abnormal bruising, no petechiae.  She states that her gynecologist is hoping the Lupron with shrink her fibroids enough for her to have a partial hysterectomy later this year.  She states that she received IV iron a few years ago while living [place] and tolerated well. However, after receiving Injectafer in February 2022 she had an anaphylactic reaction. She states that she remembers not being able to speak and then lost consciousness. She had received benadryl, pepcid, solumedrol and epi prior to coding with V tach. She received several minutes of CPR and EMS delivered one shock before ROSC.  She had elevated cardiac enzymes felt to be due to the shock and is followed by cardiology. She states that further work up including stress test were negative.  She is still having fatigue and brain fog at times and is a little sore in the middle of her chest from the shock.  She has taken oral iron in the past which was effective in increasing her counts. Her mother also has history of anemia.  Her brother passed away from complications with TTP in 2005.  Maternal grandmother had history of MS.  No known personal or familial history of sickle cell trait or disease.  No personal or familial history of cancer.  She has history of 1 miscarriage within the first few weeks of  pregnancy. She had her wisdom teeth extracted without any complications.  She denies fever, chills, n/v, cough, rash, dizziness, SOB, palpitations, abdominal pain or changes in bowel or bladder habits.  She has history of seasonal allergies and notes that she has had a sore throat at times for over a year.  No adenopathy noted on exam.  She has noted that her eyes feel tired and that her vision has worsened over the last few months. She plans to follow-up with her ophthalmologist.  No tenderness, numbness or tingling in her extremities.  No falls or syncope to report.  She does feel that she has some fluid on her knees. No swelling/edema noted in her extremities at this time. Pedal pulses are 2+.  No smoking, ETOH or recreational drug use.  She has maintained a good appetite and is staying well hydrated. Her weight is stable.  She works as a Engineer, technical sales and enjoys spending time travelling when she is able.   ROS: All other 10 point review of systems is negative.   PAST MEDICAL HISTORY:   Past Medical History:  Diagnosis Date  . CMV (cytomegalovirus infection) ([place])     ALLERGIES: Allergies  Allergen Reactions  . Contrast Media [Iodinated Diagnostic Agents] Other (See Comments)    She stated that last time she had IV contrast in the [place] she states that it caused "convulsions" and LOC.  She states that when she woke up in the ED she was unable to  move for the rest of the day, but the next day she felt "fine".  . 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 Darvon [Propoxyphene] Other (See Comments)    hallucinate      MEDICATIONS:  Current Outpatient Medications on File Prior to Visit  Medication Sig Dispense Refill  . leuprolide (LUPRON DEPOT, 53-MONTH,) 11.25 MG injection Inject 11.25 mg into the muscle every 3 (three) months. 1 each 1  . levothyroxine (SYNTHROID) 50 MCG tablet Take 50 mcg by mouth daily.    .  metoprolol tartrate (LOPRESSOR) 25 MG tablet Take by mouth.    . Multiple Vitamins-Minerals (MULTIVITAMIN WITH MINERALS) tablet Take 1 tablet by mouth daily.    . niacin 100 MG tablet Take 100 mg by mouth at bedtime.     No current facility-administered medications on file prior to visit.     PAST SURGICAL HISTORY No past surgical history on file.  FAMILY HISTORY: No family history on file.  SOCIAL HISTORY:  reports that she has never smoked. She has never used smokeless tobacco. She reports that she does not drink alcohol and does not use drugs.  PERFORMANCE STATUS: The patient's performance status is 1 - Symptomatic but completely ambulatory  PHYSICAL EXAM: Most Recent Vital Signs: There were no vitals taken for this visit. BP 118/72 (BP Location: Left Arm, Patient Position: Sitting)   Pulse 93   Temp 98.2 F (36.8 C) (Oral)   Resp 18   Ht 5' 10.5" (1.791 m)   Wt 190 lb 12.8 oz (86.5 kg)   SpO2 100%   BMI 26.99 kg/m   General Appearance:    Alert, cooperative, no distress, appears stated age  Head:    Normocephalic, without obvious abnormality, atraumatic  Eyes:    PERRL, conjunctiva/corneas clear, EOM's intact, fundi    benign, both eyes        Throat:   Lips, mucosa, and tongue normal; teeth and gums normal  Neck:   Supple, symmetrical, trachea midline, no adenopathy;    thyroid:  no enlargement/tenderness/nodules; no carotid   bruit or JVD  Back:     Symmetric, no curvature, ROM normal, no CVA tenderness  Lungs:     Clear to auscultation bilaterally, respirations unlabored  Chest Wall:    No tenderness or deformity   Heart:    Regular rate and rhythm, S1 and S2 normal, no murmur, rub   or gallop     Abdomen:     Soft, non-tender, bowel sounds active all four quadrants,    no masses, no organomegaly        Extremities:   Extremities normal, atraumatic, no cyanosis or edema  Pulses:   2+ and symmetric all extremities  Skin:   Skin color, texture, turgor normal,  no rashes or lesions  Lymph nodes:   Cervical, supraclavicular, and axillary nodes normal  Neurologic:   CNII-XII intact, normal strength, sensation and reflexes    throughout    LABORATORY DATA:  Results for orders placed or performed in visit on 07/02/20 (from the past 48 hour(s))  CBC with Differential (Cancer Center Only)     Status: Abnormal   Collection Time: 07/02/20  2:27 PM  Result Value Ref Range   WBC Count 4.0 4.0 - 10.5 K/uL   RBC 4.66 3.87 - 5.11 MIL/uL   Hemoglobin 12.9 12.0 - 15.0 g/dL   HCT 39.8 36.0 - 46.0 %   MCV 85.4 80.0 - 100.0 fL  MCH 27.7 26.0 - 34.0 pg   MCHC 32.4 30.0 - 36.0 g/dL   RDW 17.3 (H) 11.5 - 15.5 %   Platelet Count 184 150 - 400 K/uL   nRBC 0.0 0.0 - 0.2 %   Neutrophils Relative % 52 %   Neutro Abs 2.1 1.7 - 7.7 K/uL   Lymphocytes Relative 35 %   Lymphs Abs 1.4 0.7 - 4.0 K/uL   Monocytes Relative 10 %   Monocytes Absolute 0.4 0.1 - 1.0 K/uL   Eosinophils Relative 2 %   Eosinophils Absolute 0.1 0.0 - 0.5 K/uL   Basophils Relative 1 %   Basophils Absolute 0.0 0.0 - 0.1 K/uL   Immature Granulocytes 0 %   Abs Immature Granulocytes 0.01 0.00 - 0.07 K/uL    Comment: Performed at [facility] at [facility], [address], [place], [place] [address]  Save [facility])     Status: None   Collection Time: 07/02/20  2:27 PM  Result Value Ref Range   Smear Review SMEAR STAINED AND AVAILABLE FOR REVIEW     Comment: Performed at [facility] at [facility], [address] [place], [place] [address]  Reticulocytes     Status: None   Collection Time: 07/02/20  2:29 PM  Result Value Ref Range   Retic Ct Pct 1.1 0.4 - 3.1 %   RBC. 4.60 3.87 - 5.11 MIL/uL   Retic Count, Absolute 51.1 19.0 - 186.0 K/uL   Immature Retic Fract 7.4 2.3 - 15.9 %    Comment: Performed at [facility] at [facility], [address] [place], [place] [address]      RADIOGRAPHY: No results  found.     PATHOLOGY: None  ASSESSMENT/PLAN: Ms. Syfert is a very pleasant 45 yo [place] American female with history of iron deficiency secondary to uterine fibroids and heavy cycle. She is currently on Lupron Depot which has stopped her cycle. As mentioned above she has had anaphylaxis leading to code with Injectafer. At this point, if replacement is needed we will supplement orally as this has been effective in the past and she had no side effects. Hopefully, with her cycle having stopped, her anemia will resolve.  Iron studies at this time are stable. We will have her take Fusion plus every other day and follow-up in another 8 weeks.   All questions at this time were answered. She was encouraged to contact our office with any new questions or concerns.   Laverna Peace, NP

## 2020-07-03 ENCOUNTER — Telehealth: Payer: Self-pay | Admitting: Family

## 2020-07-03 LAB — IRON AND TIBC
Iron: 65 ug/dL (ref 41–142)
Saturation Ratios: 24 % (ref 21–57)
TIBC: 273 ug/dL (ref 236–444)
UIBC: 208 ug/dL (ref 120–384)

## 2020-07-03 LAB — ERYTHROPOIETIN: Erythropoietin: 13.1 m[IU]/mL (ref 2.6–18.5)

## 2020-07-03 LAB — FERRITIN: Ferritin: 41 ng/mL (ref 11–307)

## 2020-07-03 MED ORDER — FUSION PLUS PO CAPS
1.0000 | ORAL_CAPSULE | ORAL | 3 refills | Status: AC
Start: 2020-07-03 — End: ?

## 2020-07-03 NOTE — Telephone Encounter (Signed)
No los 4/5

## 2020-07-04 ENCOUNTER — Telehealth: Payer: Self-pay

## 2020-07-04 LAB — HGB FRACTIONATION CASCADE
Hgb A2: 2.4 % (ref 1.8–3.2)
Hgb A: 97.6 % (ref 96.4–98.8)
Hgb F: 0 % (ref 0.0–2.0)
Hgb S: 0 %

## 2020-07-04 NOTE — Telephone Encounter (Signed)
-----   Message from Eliezer Bottom, NP sent at 07/03/2020  5:18 PM EDT ----- Iron studies are stable. We will have her take fusion plus iron supplement, one capsule every other day. I sent in a script to her pharmacy on file. Thank you!   ----- Message ----- From: Interface, Lab In [place] Sent: 07/02/2020   2:41 PM EDT To: Eliezer Bottom, NP

## 2020-07-04 NOTE — Telephone Encounter (Signed)
Spoke to pt. and informed her that her iron studies are stable and a script for Fusion Plus Iron supplement was sent to her pharmacy of choice. She stated that she already reviewed the results in my chart and she was excited. She will go pick up her prescription.

## 2020-07-10 ENCOUNTER — Other Ambulatory Visit: Payer: Self-pay

## 2020-07-10 ENCOUNTER — Other Ambulatory Visit: Payer: Self-pay | Admitting: Obstetrics and Gynecology

## 2020-07-10 ENCOUNTER — Ambulatory Visit (INDEPENDENT_AMBULATORY_CARE_PROVIDER_SITE_OTHER): Payer: 59

## 2020-07-10 DIAGNOSIS — N939 Abnormal uterine and vaginal bleeding, unspecified: Secondary | ICD-10-CM

## 2020-07-10 DIAGNOSIS — D250 Submucous leiomyoma of uterus: Secondary | ICD-10-CM

## 2020-07-10 DIAGNOSIS — D259 Leiomyoma of uterus, unspecified: Secondary | ICD-10-CM

## 2020-07-10 DIAGNOSIS — D252 Subserosal leiomyoma of uterus: Secondary | ICD-10-CM

## 2020-07-10 LAB — ALPHA-THALASSEMIA GENOTYPR

## 2020-07-10 MED ORDER — LEUPROLIDE ACETATE (3 MONTH) 11.25 MG IM KIT
11.2500 mg | PACK | Freq: Once | INTRAMUSCULAR | Status: AC
  Administered 2020-07-10: 11.25 mg via INTRAMUSCULAR

## 2020-07-10 NOTE — Progress Notes (Signed)
Pt here for llupron 11.25mg  inj which was given IM right glut.  NDC# [phone]

## 2020-09-02 ENCOUNTER — Inpatient Hospital Stay (HOSPITAL_BASED_OUTPATIENT_CLINIC_OR_DEPARTMENT_OTHER): Payer: 59 | Admitting: Family

## 2020-09-02 ENCOUNTER — Other Ambulatory Visit: Payer: Self-pay

## 2020-09-02 ENCOUNTER — Telehealth: Payer: Self-pay | Admitting: *Deleted

## 2020-09-02 ENCOUNTER — Inpatient Hospital Stay: Payer: 59 | Attending: Hematology & Oncology

## 2020-09-02 VITALS — BP 118/77 | HR 73 | Temp 98.3°F | Resp 18 | Wt 202.0 lb

## 2020-09-02 DIAGNOSIS — R200 Anesthesia of skin: Secondary | ICD-10-CM | POA: Diagnosis not present

## 2020-09-02 DIAGNOSIS — D509 Iron deficiency anemia, unspecified: Secondary | ICD-10-CM

## 2020-09-02 DIAGNOSIS — Z79899 Other long term (current) drug therapy: Secondary | ICD-10-CM | POA: Insufficient documentation

## 2020-09-02 DIAGNOSIS — R202 Paresthesia of skin: Secondary | ICD-10-CM | POA: Insufficient documentation

## 2020-09-02 DIAGNOSIS — N920 Excessive and frequent menstruation with regular cycle: Secondary | ICD-10-CM | POA: Diagnosis not present

## 2020-09-02 DIAGNOSIS — D500 Iron deficiency anemia secondary to blood loss (chronic): Secondary | ICD-10-CM | POA: Insufficient documentation

## 2020-09-02 LAB — CBC WITH DIFFERENTIAL (CANCER CENTER ONLY)
Abs Immature Granulocytes: 0.03 10*3/uL (ref 0.00–0.07)
Basophils Absolute: 0 10*3/uL (ref 0.0–0.1)
Basophils Relative: 1 %
Eosinophils Absolute: 0.1 10*3/uL (ref 0.0–0.5)
Eosinophils Relative: 2 %
HCT: 38.4 % (ref 36.0–46.0)
Hemoglobin: 12.9 g/dL (ref 12.0–15.0)
Immature Granulocytes: 1 %
Lymphocytes Relative: 32 %
Lymphs Abs: 1.5 10*3/uL (ref 0.7–4.0)
MCH: 30.5 pg (ref 26.0–34.0)
MCHC: 33.6 g/dL (ref 30.0–36.0)
MCV: 90.8 fL (ref 80.0–100.0)
Monocytes Absolute: 0.5 10*3/uL (ref 0.1–1.0)
Monocytes Relative: 10 %
Neutro Abs: 2.6 10*3/uL (ref 1.7–7.7)
Neutrophils Relative %: 54 %
Platelet Count: 174 10*3/uL (ref 150–400)
RBC: 4.23 MIL/uL (ref 3.87–5.11)
RDW: 13.3 % (ref 11.5–15.5)
WBC Count: 4.7 10*3/uL (ref 4.0–10.5)
nRBC: 0 % (ref 0.0–0.2)

## 2020-09-02 LAB — RETICULOCYTES
Immature Retic Fract: 13.8 % (ref 2.3–15.9)
RBC.: 4.28 MIL/uL (ref 3.87–5.11)
Retic Count, Absolute: 93.3 10*3/uL (ref 19.0–186.0)
Retic Ct Pct: 2.2 % (ref 0.4–3.1)

## 2020-09-02 NOTE — Telephone Encounter (Signed)
Per 09/02/20 gave patient upcoming appointments with calendar

## 2020-09-02 NOTE — Progress Notes (Signed)
Hematology and Oncology Follow Up Visit  MAIRA CHRISTON [id number] 1975/04/11 45 y.o. 09/02/2020   Principle Diagnosis:  Iron deficiency anemia secondary to heavy cycle, now on Lupron Depot  Current Therapy:   Fusion plus PO daily   Interim History:  Ms. Jaskowiak is here today for follow-up. She is doing well and has been taking her fusion plus supplement every other day or so.  She is still on Lupron and does not have a cycles. No blood loss, bruising or petechiae.  No fever, chills, n/v, cough, rash, dizziness, SOB, chest pain, palpitations, abdominal pain or changes in bowel or bladder habits.  No swelling, tenderness, numbness or tingling in her extremities at this time. She sometimes notes numbness and tingling in her fingertips when she wakes up. This resolves once she gets up and moves around.  No falls or syncope to report.  She has maintained a good appetite and is staying well hydrated. She has been eating more vegetables and fruit and less carbs and wheat. Her weight is 202 lbs.  She has started walking for exercise.    ECOG Performance Status: 1 - Symptomatic but completely ambulatory  Medications:  Allergies as of 09/02/2020      Reactions   Contrast Media [iodinated Diagnostic Agents] Other (See Comments)   She stated that last time she had IV contrast in the [place] she states that it caused "convulsions" and LOC.  She states that when she woke up in the ED she was unable to move for the rest of the day, but the next day she felt "fine".   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 If you have any questions, ask your nurse or doctor.        Fusion Plus Caps Take 1 capsule by mouth every other day.   levothyroxine 50 MCG tablet Commonly known as: SYNTHROID Take 50 mcg by mouth  daily.   Lupron Depot (11-Month) 11.25 MG injection Generic drug: leuprolide Inject 11.25 mg into the muscle every 3 (three) months.   metoprolol tartrate 25 MG tablet Commonly known as: LOPRESSOR Take by mouth.   multivitamin with minerals tablet Take 1 tablet by mouth daily.   niacin 100 MG tablet Take 100 mg by mouth at bedtime.       Allergies:  Allergies  Allergen Reactions  . Contrast Media [Iodinated Diagnostic Agents] Other (See Comments)    She stated that last time she had IV contrast in the [place] she states that it caused "convulsions" and LOC.  She states that when she woke up in the ED she was unable to move for the rest of the day, but the next day she felt "fine".  . 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 Darvon [Propoxyphene] Other (See Comments)    hallucinate    Past Medical History, Surgical history, Social history, and Family History were reviewed and updated.  Review of Systems: All other 10 point review of systems is negative.   Physical Exam:  weight is 202 lb (91.6 kg). Her oral temperature is 98.3 F (36.8 C). Her blood pressure is 118/77 and her pulse is 73. Her respiration is 18 and oxygen saturation is 99%.   Wt Readings from Last 3 Encounters:  09/02/20  202 lb (91.6 kg)  07/02/20 190 lb 12.8 oz (86.5 kg)  10/18/19 183 lb (83 kg)    Ocular: Sclerae unicteric, pupils equal, round and reactive to light Ear-nose-throat: Oropharynx clear, dentition fair Lymphatic: No cervical or supraclavicular adenopathy Lungs no rales or rhonchi, good excursion bilaterally Heart regular rate and rhythm, no murmur appreciated Abd soft, nontender, positive bowel sounds MSK no focal spinal tenderness, no joint edema Neuro: non-focal, well-oriented, appropriate affect Breasts: Deferred   Lab Results  Component Value Date   WBC 4.7 09/02/2020   HGB 12.9 09/02/2020   HCT 38.4 09/02/2020    MCV 90.8 09/02/2020   PLT 174 09/02/2020   Lab Results  Component Value Date   FERRITIN 41 07/02/2020   IRON 65 07/02/2020   TIBC 273 07/02/2020   UIBC 208 07/02/2020   IRONPCTSAT 24 07/02/2020   Lab Results  Component Value Date   RETICCTPCT 2.2 09/02/2020   RBC 4.23 09/02/2020   RBC 4.28 09/02/2020   No results found for: KPAFRELGTCHN, LAMBDASER, KAPLAMBRATIO No results found for: IGGSERUM, IGA, IGMSERUM No results found for: Odetta Pink, SPEI   Chemistry      Component Value Date/Time   NA 140 07/02/2020 1427   K 3.8 07/02/2020 1427   CL 105 07/02/2020 1427   CO2 28 07/02/2020 1427   BUN 17 07/02/2020 1427   CREATININE 0.91 07/02/2020 1427      Component Value Date/Time   CALCIUM 9.6 07/02/2020 1427   ALKPHOS 82 07/02/2020 1427   AST 15 07/02/2020 1427   ALT 12 07/02/2020 1427   BILITOT 0.2 (L) 07/02/2020 1427       Impression and Plan: Ms. Moore is a very pleasant 45 yo African American female with history of iron deficiency secondary to uterine fibroids and heavy cycle. She is currently on Lupron Depot which has stopped her cycle. Iron studies are pending. So far she has done well tolerating Fusion plus and will continue to take every other day.  Follow-up in 6 months. If her counts are stable at that time we will let her go from our clinic.  She can contact our office with any questions or concerns.   Laverna Peace, NP 6/6/20221:58 PM

## 2020-09-03 LAB — FERRITIN: Ferritin: 24 ng/mL (ref 11–307)

## 2020-09-03 LAB — IRON AND TIBC
Iron: 82 ug/dL (ref 41–142)
Saturation Ratios: 28 % (ref 21–57)
TIBC: 291 ug/dL (ref 236–444)
UIBC: 210 ug/dL (ref 120–384)

## 2020-09-09 ENCOUNTER — Telehealth: Payer: Self-pay

## 2020-09-09 NOTE — Telephone Encounter (Signed)
Megan from [employer] calling; has a fax of questions she is sending on pt.  [phone]

## 2020-09-10 NOTE — Telephone Encounter (Signed)
[employer] calling; they are going to fax over questions; they are working on appeal.  [phone]

## 2020-09-11 ENCOUNTER — Ambulatory Visit
Admission: RE | Admit: 2020-09-11 | Discharge: 2020-09-11 | Disposition: A | Discharge status: Home / Self Care | Payer: 59 | Source: Ambulatory Visit | Attending: Obstetrics and Gynecology | Admitting: Obstetrics and Gynecology

## 2020-09-11 DIAGNOSIS — D250 Submucous leiomyoma of uterus: Secondary | ICD-10-CM

## 2020-09-11 DIAGNOSIS — N939 Abnormal uterine and vaginal bleeding, unspecified: Secondary | ICD-10-CM

## 2020-09-11 NOTE — Telephone Encounter (Signed)
[employer] from [employer] calling again to say they have faxed over appeal questions for depo lupron.  [phone]

## 2020-09-11 NOTE — Telephone Encounter (Signed)
She completed her 6 month course at this point.  We do not continue it for longer than that, at this point she needs a follow up to evaluate the degree the fibroid has shrunk and discuss surgical management options

## 2020-09-12 NOTE — Telephone Encounter (Signed)
Patient is scheduled for 09/16/20 with AMS

## 2020-09-16 ENCOUNTER — Other Ambulatory Visit: Payer: Self-pay

## 2020-09-16 ENCOUNTER — Ambulatory Visit (INDEPENDENT_AMBULATORY_CARE_PROVIDER_SITE_OTHER): Payer: 59 | Admitting: Obstetrics and Gynecology

## 2020-09-16 ENCOUNTER — Encounter: Payer: Self-pay | Admitting: Obstetrics and Gynecology

## 2020-09-16 VITALS — BP 138/72 | Ht 70.5 in | Wt 204.0 lb

## 2020-09-16 DIAGNOSIS — D252 Subserosal leiomyoma of uterus: Secondary | ICD-10-CM | POA: Diagnosis not present

## 2020-09-16 DIAGNOSIS — D251 Intramural leiomyoma of uterus: Secondary | ICD-10-CM

## 2020-09-16 DIAGNOSIS — N939 Abnormal uterine and vaginal bleeding, unspecified: Secondary | ICD-10-CM | POA: Diagnosis not present

## 2020-09-16 NOTE — Progress Notes (Signed)
Gynecology Ultrasound Follow Up  Chief Complaint:  Chief Complaint  Patient presents with   Gynecologic Exam    Discuss surgery. RM 5     History of Present Illness: Patient is a 45 y.o. female who presents today for ultrasound evaluation of abnormal uterine and know uterine fibroids status post 3 month of uterine fibroids.  Ultrasound demonstrates the following findgins Adnexa: no masses seen  Uterus: Grossly enlarged, volume 668mL, with no interval decrease in uterine size largest fibroid 12..7cm in diameter, with endometrial stripe  86mm no focal abnormalities Additional: no free   Review of Systems: Review of Systems  Constitutional: Negative.   Gastrointestinal: Negative.   Genitourinary: Negative.    Past Medical History:  Past Medical History:  Diagnosis Date   CMV (cytomegalovirus infection) ([place])     Past Surgical History:  History reviewed. No pertinent surgical history.  Gynecologic History:  No LMP recorded. Last Pap: 08/02/2019 normal  Family History:  Family History  Problem Relation Age of Onset   Hypertension Mother    Diabetes Mother    Stroke Mother     Social History:  Social History   Socioeconomic History   Marital status: Single    Spouse name: Not on file   Number of children: Not on file   Years of education: Not on file   Highest education level: Not on file  Occupational History   Not on file  Tobacco Use   Smoking status: Never   Smokeless tobacco: Never  Vaping Use   Vaping Use: Never used  Substance and Sexual Activity   Alcohol use: No   Drug use: No   Sexual activity: Not Currently    Birth control/protection: None  Other Topics Concern   Not on file  Social History Narrative   Not on file   Social Determinants of Health   Financial Resource Strain: Not on file  Food Insecurity: Not on file  Transportation Needs: Not on file  Physical Activity: Not on file  Stress: Not on file  Social Connections: Not on  file  Intimate Partner Violence: Not on file    Allergies:  Allergies  Allergen Reactions   Contrast Media [Iodinated Diagnostic Agents] Other (See Comments)    She stated that last time she had IV contrast in the [place] she states that it caused "convulsions" and LOC.  She states that when she woke up in the ED she was unable to move for the rest of the day, but the next day she felt "fine".   Ferric Carboxymaltose Anaphylaxis    Anaphylaxis requiring epinephrine and CPR/defibrillator   Darvon [Propoxyphene] Other (See Comments)    hallucinate    Medications: Prior to Admission medications   Medication Sig Start Date End Date Taking? Authorizing Provider  Iron-FA-B Cmp-C-Biot-Probiotic (FUSION PLUS) CAPS Take 1 capsule by mouth every other day. 07/03/20  Yes [place], Holli Humbles, NP  levothyroxine (SYNTHROID) 50 MCG tablet Take 50 mcg by mouth daily. 04/23/20  Yes [provider]  metoprolol tartrate (LOPRESSOR) 25 MG tablet Take by mouth. 06/04/20  Yes [provider]  Multiple Vitamins-Minerals (MULTIVITAMIN WITH MINERALS) tablet Take 1 tablet by mouth daily.   Yes [provider]  niacin 100 MG tablet Take 100 mg by mouth at bedtime.   Yes [provider]  pantoprazole (PROTONIX) 40 MG tablet Take 1 tablet by mouth daily. 08/13/20  Yes [provider]  leuprolide (LUPRON DEPOT, 33-MONTH,) 11.25 MG injection Inject 11.25  mg into the muscle every 3 (three) months. 03/08/20   Malachy Mood, MD    Physical Exam Vitals: Blood pressure 138/72, height 5' 10.5" (1.791 m), weight 204 lb (92.5 kg).  General: NAD HEENT: normocephalic, anicteric Pulmonary: No increased work of breathing Extremities: no edema, erythema, or tenderness Neurologic: Grossly intact, normal gait Psychiatric: mood appropriate, affect full  US PELVIC COMPLETE WITH TRANSVAGINAL  Result Date: 09/11/2020 CLINICAL DATA:  Abnormal uterine bleeding, history of  uterine leiomyomas, on Lupron and Depo-Medrol EXAM: TRANSABDOMINAL AND TRANSVAGINAL ULTRASOUND OF PELVIS TECHNIQUE: Both transabdominal and transvaginal ultrasound examinations of the pelvis were performed. Transabdominal technique was performed for global imaging of the pelvis including uterus, ovaries, adnexal regions, and pelvic cul-de-sac. It was necessary to proceed with endovaginal exam following the transabdominal exam to visualize the uterine leiomyomata. COMPARISON:  03/20/2020 FINDINGS: Uterus Measurements: 19.0 x 9.0 x 7.4 cm = volume: 656 mL. Anteverted. Enlarged uterus containing multiple leiomyomata. Large fundal leiomyoma 12.7 x 9.0 x 11.9 cm, previously 12.5 x 9.1 x 10.5 cm. Additional leiomyomata measure 4.2 cm posterior RIGHT upper uterus, 3.2 cm posterior RIGHT upper uterus, and 4.6 cm anterior lower uterus. Multiple additional leiomyomata seen. Stability of the remaining lesions is difficult to accurately compare to sizes on prior exams due to the multiplicity of leiomyomata present. Endometrium Thickness: 11 mm. No endometrial fluid. Portions of endometrial complex of poorly seen due to distortion by leiomyomata. Right ovary Measurements: 2.8 x 1.3 x 2.1 cm = volume: 4 mL. Normal morphology without mass Left ovary Measurements: 3.1 x 2.1 x 3.1 cm = volume: 11 mL. Normal morphology without mass Other findings No free pelvic fluid.  No adnexal masses. IMPRESSION: Enlarged uterus containing numerous leiomyomata, up to 12.7 cm diameter. Unremarkable endometrial complex and ovaries. Electronically Signed   By: Lavonia Dana M.D.   On: 09/11/2020 17:04    Assessment: 45 y.o. G1P0010 medication follow up   Plan: Problem List Items Addressed This Visit   None Visit Diagnoses     Abnormal uterine bleeding    -  Primary   Relevant Orders   Ambulatory referral to Interventional Radiology   Intramural and subserous leiomyoma of uterus       Relevant Orders   Ambulatory referral to  Interventional Radiology       1) Follow up ultrasound no change in uterine volume - referral for [place] Dr. Floy Sabina - interested in robotic myomectomy will have patient see Dr. Gilman Schmidt post   2) A total of 15 minutes were spent in face-to-face contact with the patient during this encounter with over half of that time devoted to counseling and coordination of care.  3) Return in about 4 months (around 01/16/2021) for Follow up surgical planning [place].    Malachy Mood, MD, [facility], [facility] 09/16/2020, 2:32 PM

## 2020-09-18 ENCOUNTER — Telehealth: Payer: Self-pay

## 2020-09-18 NOTE — Telephone Encounter (Signed)
Pt calling; states AMS is going to schedule her for an embolization or hysterectomy; pt is asking if she still needs to get her depo shot in July?  She is starting to spot; this was started to stop her period d/t anemia.  [phone]

## 2020-09-19 NOTE — Telephone Encounter (Signed)
If she only received one shot she could get a second, however here current insurance has declined prior authorization

## 2020-09-19 NOTE — Telephone Encounter (Signed)
Pt states she has actually had two shots.  She asked if someone would call her about the embolization; adv she would - the lady that does that will be back next week.

## 2020-10-07 ENCOUNTER — Other Ambulatory Visit: Payer: 59

## 2020-10-08 ENCOUNTER — Telehealth: Payer: Self-pay

## 2020-10-08 NOTE — Telephone Encounter (Signed)
Cosa from [facility] calling to let us know pt states she is finished with the medication; to let them know if pt needs to be back on it. (Lupron)  Their # is  [phone]

## 2020-10-08 NOTE — Telephone Encounter (Signed)
I think the referral was placed at her 09/16/20 visit

## 2020-10-15 ENCOUNTER — Ambulatory Visit: Payer: 59 | Admitting: Obstetrics and Gynecology

## 2020-11-11 ENCOUNTER — Telehealth: Payer: Self-pay

## 2020-11-11 NOTE — Telephone Encounter (Signed)
I called pt to see if I could help. She adv that she contacted the vascular office and it is now straightened out.

## 2020-11-11 NOTE — Telephone Encounter (Signed)
Pt calling; her ins denied her procedure b/c of coronary issues;  her paperwork shows it's not a coronary procedure.  Who does she need to speak to?  [phone]

## 2020-11-28 HISTORY — PX: UTERINE FIBROID EMBOLIZATION: SHX825

## 2020-12-10 ENCOUNTER — Telehealth: Payer: Self-pay

## 2020-12-10 NOTE — Telephone Encounter (Signed)
Pt calling to speak to [facility] regarding surgery.  [phone]

## 2020-12-11 ENCOUNTER — Other Ambulatory Visit: Payer: Self-pay | Admitting: Obstetrics and Gynecology

## 2020-12-11 NOTE — Telephone Encounter (Signed)
We would allow that procedure to take effect for 6 months then re-assess the size of the fibroids.  She was interested in a robotic myomectomy so I was going to have her follow up with Dr. Gilman Schmidt for her to evalute feasibility as I only do these open

## 2021-03-04 ENCOUNTER — Inpatient Hospital Stay: Payer: 59 | Attending: Family | Admitting: Family

## 2021-03-04 ENCOUNTER — Other Ambulatory Visit: Payer: 59

## 2021-09-10 ENCOUNTER — Ambulatory Visit: Payer: Self-pay | Admitting: Obstetrics and Gynecology

## 2021-09-12 ENCOUNTER — Ambulatory Visit: Payer: Self-pay | Admitting: Obstetrics and Gynecology

## 2021-09-16 ENCOUNTER — Telehealth: Payer: Self-pay | Admitting: Obstetrics & Gynecology

## 2021-09-16 ENCOUNTER — Telehealth: Payer: Self-pay | Admitting: Obstetrics and Gynecology

## 2021-09-16 ENCOUNTER — Encounter: Payer: Self-pay | Admitting: *Deleted

## 2021-09-16 NOTE — Telephone Encounter (Signed)
Patient is a new gyn coming from [facility] for a surgery consult. Her last ultrasound was a year ago and was wondering if you will need another ultrasound before appointment. She will be coming from [place]. Please advise.

## 2021-09-16 NOTE — Telephone Encounter (Signed)
Pt is scheduled for a surgery consult with Dr. Amalia Hailey on 7/20.  The pt is wanting to have an ultrasound before she has this appt.  Dr. Georgianne Fick had placed an order for an [place] before he left, but the [place] was not completed.

## 2021-09-25 ENCOUNTER — Ambulatory Visit (INDEPENDENT_AMBULATORY_CARE_PROVIDER_SITE_OTHER): Payer: 59 | Admitting: Obstetrics & Gynecology

## 2021-09-25 ENCOUNTER — Encounter: Payer: Self-pay | Admitting: Obstetrics & Gynecology

## 2021-09-25 VITALS — BP 111/68 | HR 73 | Ht 70.0 in

## 2021-09-25 DIAGNOSIS — D252 Subserosal leiomyoma of uterus: Secondary | ICD-10-CM | POA: Diagnosis not present

## 2021-09-25 DIAGNOSIS — D251 Intramural leiomyoma of uterus: Secondary | ICD-10-CM | POA: Diagnosis not present

## 2021-09-25 NOTE — Progress Notes (Signed)
GYN VISIT Patient name: Cristina Robertson MRN [id number]  Date of birth: [birth year] Chief Complaint:   Discuss surgery  History of Present Illness:   Cristina Robertson is a 46 y.o. G59P0010 female being seen today for the following concerns:  AUB: This has been an ongoing issue for over 10 yrs.  Due to the large size of her uterus and the desire to avoid a vertical incision, she has postponed surgical intervention.  Previously treated at outside facility- records were reviewed.  She is s/p Depot Lupron and [place] completed in Sept 2022.  Per records- 08/2020: Enlarged uterus 19x9x7cm, 681m Prior [place]7/2021: 11 fibroids measured, largest 13cm  Of note, it seems as though her last few periods have somewhat improved. The past 2 periods have lasted for 3 days- typically changing a pad or pad/tampon combo 3-4x per day.    Prior to the last 2 periods, still heavy requiring both a tampon/pad every 4x/day and periods lasted for about 5-6 days.  Typically Day 2-3 are the heaviest.   Significant dysmenorrhea- some improvement with OTC medication (ibuprofen)  Bleeding has led to iron def. Anemia.  Pt Jehovah's witness declines blood products.  Per records, she did try to have iron transfusion and noted to have iron transfusion reaction- went into ventricular tachycardia with MI.  While her bleeding has improved, she wishes to discuss surgical intervention due to the mass effect of her uterus.  Notes pelvic pressure and bloating.  Notes urinary frequency and discomfort.  Feels like she has increased flatus due to this discomfort.  She does not desire a pregnancy and wishes to proceed with hysterectomy.   Patient's last menstrual period was 09/08/2021.     09/25/2021   11:05 AM  Depression screen PHQ 2/9  Decreased Interest 0  Down, Depressed, Hopeless 0  PHQ - 2 Score 0  Altered sleeping 2  Tired, decreased energy 2  Change in appetite 2  Feeling bad or failure about yourself  0  Trouble  concentrating 1  Moving slowly or fidgety/restless 0  Suicidal thoughts 0  PHQ-9 Score 7     Review of Systems:   Pertinent items are noted in HPI Denies fever/chills, dizziness, headaches, visual disturbances, fatigue, shortness of breath, chest pain, vomiting. Pertinent History Reviewed:  Reviewed past medical,surgical, social, obstetrical and family history.  Reviewed problem list, medications and allergies. Physical Assessment:   Vitals:   09/25/21 1053  BP: 111/68  Pulse: 73  Height: '5\' 10"'$  (1.778 m)  Body mass index is 29.27 kg/m.       Physical Examination:   General appearance: alert, well appearing, and in no distress  Psych: mood appropriate, normal affect  Skin: warm & dry   Cardiovascular: normal heart rate noted  Respiratory: normal respiratory effort, no distress  Abdomen: soft, non-tender, enlarged mobile uterus- below umbilicus  Pelvic: VULVA: normal appearing vulva with no masses, tenderness or lesions, VAGINA: normal appearing vagina with normal color and discharge, no lesions, CERVIX: difficulty with visualization due to displacement from large uterus and pt discomfort. On bimanual exam enlarged mobile uterus multi-lobulated,  Extremities: no edema, no calf tenderness bilaterally Chaperone: Latisha Cresenzo    Assessment & Plan:  1) Uterine leiomyomas -Based on exam, suspect uterus is smaller than prior [place], plan for repeat [place] next available to re-evaluate current size -discussed surgical intervention of Total abdominal hysterectomy and bilateral salpingectomy -pt initially wishes to retain cervix- discussed pros/cons.  Reviewed that if she retains  cervix will need to continue with cervical cancer screening.  No additional benefits of cervical retention to prevent prolapse or improved sexual benefits currently known.  '[]'$  pt will reconsider cervix removal and will review and follow up appt -Questions and concerns were addressed and desires to  proceed at next available -preop labs to be ordered, [place] order placed  2) h/o iron def. Anemia -Last lab work June 2022- Hgb stable -current menses markedly improved from prior bleeding pattern   Orders Placed This Encounter  Procedures   US PELVIC COMPLETE WITH TRANSVAGINAL    Return for preop end Aug/Early Sept with Hannie Shoe.   Janyth Pupa, DO Attending [place], [facility] for [employer], [place]

## 2021-10-01 ENCOUNTER — Ambulatory Visit (INDEPENDENT_AMBULATORY_CARE_PROVIDER_SITE_OTHER): Payer: 59

## 2021-10-01 DIAGNOSIS — D251 Intramural leiomyoma of uterus: Secondary | ICD-10-CM

## 2021-10-01 DIAGNOSIS — D252 Subserosal leiomyoma of uterus: Secondary | ICD-10-CM

## 2021-10-03 ENCOUNTER — Encounter: Payer: Self-pay | Admitting: Obstetrics & Gynecology

## 2021-10-07 ENCOUNTER — Other Ambulatory Visit: Payer: Medicaid Other

## 2021-10-07 ENCOUNTER — Ambulatory Visit: Payer: Medicaid Other | Admitting: Family

## 2021-10-07 ENCOUNTER — Other Ambulatory Visit: Payer: 59

## 2021-10-16 ENCOUNTER — Encounter: Payer: Self-pay | Admitting: Obstetrics and Gynecology

## 2021-10-16 ENCOUNTER — Ambulatory Visit (INDEPENDENT_AMBULATORY_CARE_PROVIDER_SITE_OTHER): Payer: 59 | Admitting: Obstetrics and Gynecology

## 2021-10-16 VITALS — BP 126/80 | Ht 70.5 in | Wt 185.8 lb

## 2021-10-16 DIAGNOSIS — D252 Subserosal leiomyoma of uterus: Secondary | ICD-10-CM

## 2021-10-16 DIAGNOSIS — D251 Intramural leiomyoma of uterus: Secondary | ICD-10-CM

## 2021-10-16 NOTE — Progress Notes (Signed)
Patient presents today to discuss fibroids and options regarding them. She states she would like them removed, has had embolization in the past. Patient reports regular menstrual cycles with no abnormal bleeding. She states she has considered a hysterectomy in the past. No other concerns at this time.

## 2021-10-16 NOTE — Progress Notes (Signed)
HPI:      [name] is a 46 y.o. G1P0010 who LMP was Patient's last menstrual period was 10/04/2021 (approximate).  Subjective:   She presents today because she has large uterine fibroids which cause abdominal and pelvic discomfort.  She has previously undergone fibroid embolization which made her periods a little bit lighter and cramps a little bit less but was not satisfactory.  She reports that she could not walk correctly for greater than a month after her embolization.  Recent ultrasound reveals approximately a 20-week size uterus with multiple large fibroids.  She would like to discuss definitive management.    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 She does not have a problem list on file. She  has a past surgical history that includes Uterine fibroid embolization (11/2020). Her family history includes Diabetes in her mother; Hypertension in her mother; Stroke in her mother. She  reports that she has never smoked. She has never used smokeless tobacco. She reports that she does not drink alcohol and does not use drugs. She has a current medication list which includes the following prescription(s): fusion plus, levothyroxine, multivitamin with minerals, and niacin. She is allergic to contrast media [iodinated contrast media], ferric carboxymaltose, darvon [propoxyphene], and epinephrine.       Review of Systems:  Review of Systems  Constitutional: Denied constitutional symptoms, night sweats, recent illness, fatigue, fever, insomnia and weight loss.  Eyes: Denied eye symptoms, eye pain, photophobia, vision change and visual disturbance.  Ears/Nose/Throat/Neck: Denied ear, nose, throat or neck symptoms, hearing loss, nasal discharge, sinus congestion and sore  throat.  Cardiovascular: Denied cardiovascular symptoms, arrhythmia, chest pain/pressure, edema, exercise intolerance, orthopnea and palpitations.  Respiratory: Denied pulmonary symptoms, asthma, pleuritic pain, productive sputum, cough, dyspnea and wheezing.  Gastrointestinal: Denied, gastro-esophageal reflux, melena, nausea and vomiting.  Genitourinary: See HPI for additional information.  Musculoskeletal: Denied musculoskeletal symptoms, stiffness, swelling, muscle weakness and myalgia.  Dermatologic: Denied dermatology symptoms, rash and scar.  Neurologic: Denied neurology symptoms, dizziness, headache, neck pain and syncope.  Psychiatric: Denied psychiatric symptoms, anxiety and depression.  Endocrine: Denied endocrine symptoms including hot flashes and night sweats.   Meds:   Current Outpatient Medications on File Prior to Visit  Medication Sig Dispense Refill   Iron-FA-B Cmp-C-Biot-Probiotic (FUSION PLUS) CAPS Take 1 capsule by mouth every other day. 30 capsule 3   levothyroxine (SYNTHROID) 50 MCG tablet Take 50 mcg by mouth daily.     Multiple Vitamins-Minerals (MULTIVITAMIN WITH MINERALS) tablet Take 1 tablet by mouth daily.     niacin 100 MG tablet Take 100 mg by mouth at bedtime.     No current facility-administered medications on file prior to visit.      Objective:     Vitals:   10/16/21 1514  BP: 126/80   Filed Weights   10/16/21 1514  Weight: 185 lb 12.8 oz (84.3 kg)              Abdominal examination reveals a large abdominal pelvic mass consistent with uterine fibroids up to the level of the umbilicus.          Assessment:    G1P0010 There are no problems to display for this patient.    1. Intramural and subserous  leiomyoma of uterus     Very large uterine fibroids causing pelvic discomfort and pain.  Previous failed fibroid embolization   Plan:            1.  We have discussed multiple options but specifically have discussed hysterectomy as an  option.  Types of hysterectomy also discussed.  It seems very likely she will need either a TAH. Very large uterus and patient has not had vaginal births. Patient thinks at this time she would like to keep her ovaries.  Risks and benefits of nephrectomy discussed.  All questions answered. She will return when she has decided upon definitive management.  She may schedule a preop if she so desires surgery. Orders No orders of the defined types were placed in this encounter.   No orders of the defined types were placed in this encounter.     F/U  No follow-ups on file. I spent 37 minutes involved in the care of this patient preparing to see the patient by obtaining and reviewing her medical history (including labs, imaging tests and prior procedures), documenting clinical information in the electronic health record (EHR), counseling and coordinating care plans, writing and sending prescriptions, ordering tests or procedures and in direct communicating with the patient and medical staff discussing pertinent items from her history and physical exam.  Finis Bud, M.D. 10/16/2021 4:50 PM

## 2021-10-20 ENCOUNTER — Other Ambulatory Visit: Payer: Self-pay | Admitting: Family

## 2021-10-20 DIAGNOSIS — D509 Iron deficiency anemia, unspecified: Secondary | ICD-10-CM

## 2021-10-21 ENCOUNTER — Inpatient Hospital Stay: Payer: 59 | Attending: Family

## 2021-10-21 ENCOUNTER — Inpatient Hospital Stay (HOSPITAL_BASED_OUTPATIENT_CLINIC_OR_DEPARTMENT_OTHER): Payer: 59 | Admitting: Family

## 2021-10-21 ENCOUNTER — Encounter: Payer: Self-pay | Admitting: Family

## 2021-10-21 VITALS — BP 113/73 | HR 72 | Temp 97.9°F | Resp 17

## 2021-10-21 DIAGNOSIS — N920 Excessive and frequent menstruation with regular cycle: Secondary | ICD-10-CM | POA: Insufficient documentation

## 2021-10-21 DIAGNOSIS — D509 Iron deficiency anemia, unspecified: Secondary | ICD-10-CM | POA: Diagnosis not present

## 2021-10-21 DIAGNOSIS — R5383 Other fatigue: Secondary | ICD-10-CM | POA: Diagnosis not present

## 2021-10-21 LAB — CBC WITH DIFFERENTIAL (CANCER CENTER ONLY)
Abs Immature Granulocytes: 0.01 10*3/uL (ref 0.00–0.07)
Basophils Absolute: 0 10*3/uL (ref 0.0–0.1)
Basophils Relative: 1 %
Eosinophils Absolute: 0.1 10*3/uL (ref 0.0–0.5)
Eosinophils Relative: 2 %
HCT: 40.4 % (ref 36.0–46.0)
Hemoglobin: 13.3 g/dL (ref 12.0–15.0)
Immature Granulocytes: 0 %
Lymphocytes Relative: 39 %
Lymphs Abs: 1.8 10*3/uL (ref 0.7–4.0)
MCH: 31 pg (ref 26.0–34.0)
MCHC: 32.9 g/dL (ref 30.0–36.0)
MCV: 94.2 fL (ref 80.0–100.0)
Monocytes Absolute: 0.3 10*3/uL (ref 0.1–1.0)
Monocytes Relative: 7 %
Neutro Abs: 2.4 10*3/uL (ref 1.7–7.7)
Neutrophils Relative %: 51 %
Platelet Count: 214 10*3/uL (ref 150–400)
RBC: 4.29 MIL/uL (ref 3.87–5.11)
RDW: 12.1 % (ref 11.5–15.5)
WBC Count: 4.7 10*3/uL (ref 4.0–10.5)
nRBC: 0 % (ref 0.0–0.2)

## 2021-10-21 LAB — RETICULOCYTES
Immature Retic Fract: 8.3 % (ref 2.3–15.9)
RBC.: 4.32 MIL/uL (ref 3.87–5.11)
Retic Count, Absolute: 70 10*3/uL (ref 19.0–186.0)
Retic Ct Pct: 1.6 % (ref 0.4–3.1)

## 2021-10-21 LAB — FERRITIN: Ferritin: 8 ng/mL — ABNORMAL LOW (ref 11–307)

## 2021-10-21 NOTE — Progress Notes (Signed)
Hematology and Oncology Follow Up Visit  Cristina Robertson [id number] Dec 04, 1975 46 y.o. 10/21/2021   Principle Diagnosis:  Iron deficiency anemia secondary to heavy cycle, now on Lupron [place]   Current Therapy:        Fusion plus PO daily Of note: patient had anaphylactic reaction to [facility]   Interim History:  Cristina Robertson is here today for follow-up. She is dong well but does note some fatigue at times.  She had a uterine embolization and her cycle has been regular with a more normal flow.  No other blood loss noted. No bruising or petechiae.  She is scheduled for hysterectomy on 12/03/2021.  No fever, chills, n/v, cough, rash, SOB, chest pain, palpitations, abdominal pain or changes in bowel or bladder habits. No tenderness, numbness or tingling in her extremities.  No falls or syncope.  Appetite and hydration are good. She eats a healthy diet.   ECOG Performance Status: 1 - Symptomatic but completely ambulatory  Medications:  Allergies as of 10/21/2021       Reactions   Contrast Media [iodinated Contrast Media] Other (See Comments)   She stated that last time she had IV contrast in the [place] she states that it caused "convulsions" and LOC.  She states that when she woke up in the ED she was unable to move for the rest of the day, but the next day she felt "fine".   Ferric Carboxymaltose Anaphylaxis   Anaphylaxis requiring epinephrine and CPR/defibrillator   Darvon [propoxyphene] Other (See Comments)   hallucinate   Epinephrine Other (See Comments)        Medication List        Accurate as of October 21, 2021  3:19 PM. If you have any questions, ask your nurse or doctor.          Fusion Plus Caps Take 1 capsule by mouth every other day.   levothyroxine 50 MCG tablet Commonly known as: SYNTHROID Take 50 mcg by mouth daily.   multivitamin with minerals tablet Take 1 tablet by mouth daily.   niacin 100 MG tablet Take 100 mg by mouth at bedtime.         Allergies:  Allergies  Allergen Reactions   Contrast Media [Iodinated Contrast Media] Other (See Comments)    She stated that last time she had IV contrast in the [place] she states that it caused "convulsions" and LOC.  She states that when she woke up in the ED she was unable to move for the rest of the day, but the next day she felt "fine".   Ferric Carboxymaltose Anaphylaxis    Anaphylaxis requiring epinephrine and CPR/defibrillator   Darvon [Propoxyphene] Other (See Comments)    hallucinate   Epinephrine Other (See Comments)    Past Medical History, Surgical history, Social history, and Family History were reviewed and updated.  Review of Systems: All other 10 point review of systems is negative.   Physical Exam:  oral temperature is 97.9 F (36.6 C). Her blood pressure is 113/73 and her pulse is 72. Her respiration is 17 and oxygen saturation is 100%.   Wt Readings from Last 3 Encounters:  10/16/21 185 lb 12.8 oz (84.3 kg)  09/16/20 204 lb (92.5 kg)  09/02/20 202 lb (91.6 kg)    Ocular: Sclerae unicteric, pupils equal, round and reactive to light Ear-nose-throat: Oropharynx clear, dentition fair Lymphatic: No cervical or supraclavicular adenopathy Lungs no rales or rhonchi, good excursion bilaterally Heart regular rate and rhythm,  no murmur appreciated Abd soft, nontender, positive bowel sounds MSK no focal spinal tenderness, no joint edema Neuro: non-focal, well-oriented, appropriate affect Breasts: Deferred   Lab Results  Component Value Date   WBC 4.7 10/21/2021   HGB 13.3 10/21/2021   HCT 40.4 10/21/2021   MCV 94.2 10/21/2021   PLT 214 10/21/2021   Lab Results  Component Value Date   FERRITIN 24 09/02/2020   IRON 82 09/02/2020   TIBC 291 09/02/2020   UIBC 210 09/02/2020   IRONPCTSAT 28 09/02/2020   Lab Results  Component Value Date   RETICCTPCT 1.6 10/21/2021   RBC 4.29 10/21/2021   RBC 4.32 10/21/2021   No results found for:  "KPAFRELGTCHN", "LAMBDASER", "KAPLAMBRATIO" No results found for: "IGGSERUM", "IGA", "IGMSERUM" No results found for: "TOTALPROTELP", "ALBUMINELP", "A1GS", "A2GS", "BETS", "BETA2SER", "GAMS", "MSPIKE", "SPEI"   Chemistry      Component Value Date/Time   NA 140 07/02/2020 1427   K 3.8 07/02/2020 1427   CL 105 07/02/2020 1427   CO2 28 07/02/2020 1427   BUN 17 07/02/2020 1427   CREATININE 0.91 07/02/2020 1427      Component Value Date/Time   CALCIUM 9.6 07/02/2020 1427   ALKPHOS 82 07/02/2020 1427   AST 15 07/02/2020 1427   ALT 12 07/02/2020 1427   BILITOT 0.2 (L) 07/02/2020 1427       Impression and Plan: Cristina Robertson is a very pleasant 46 yo African American female with history of iron deficiency secondary to uterine fibroids and heavy cycle.  She is scheduled for hysterectomy on 12/03/2021.  We will now follow-up as needed.   Lottie Dawson, NP 7/25/20233:19 PM

## 2021-10-22 LAB — IRON AND IRON BINDING CAPACITY (CC-WL,HP ONLY)
Iron: 47 ug/dL (ref 28–170)
Saturation Ratios: 13 % (ref 10.4–31.8)
TIBC: 358 ug/dL (ref 250–450)
UIBC: 311 ug/dL (ref 148–442)

## 2021-11-03 ENCOUNTER — Ambulatory Visit (INDEPENDENT_AMBULATORY_CARE_PROVIDER_SITE_OTHER): Payer: 59 | Admitting: Obstetrics & Gynecology

## 2021-11-03 ENCOUNTER — Encounter: Payer: Self-pay | Admitting: Obstetrics & Gynecology

## 2021-11-03 VITALS — BP 121/74 | HR 95 | Ht 70.5 in | Wt 184.8 lb

## 2021-11-03 DIAGNOSIS — Z531 Procedure and treatment not carried out because of patient's decision for reasons of belief and group pressure: Secondary | ICD-10-CM | POA: Insufficient documentation

## 2021-11-03 DIAGNOSIS — N946 Dysmenorrhea, unspecified: Secondary | ICD-10-CM

## 2021-11-03 DIAGNOSIS — R102 Pelvic and perineal pain: Secondary | ICD-10-CM

## 2021-11-03 DIAGNOSIS — D259 Leiomyoma of uterus, unspecified: Secondary | ICD-10-CM

## 2021-11-03 NOTE — Progress Notes (Signed)
GYN VISIT Patient name: Cristina Robertson MRN [id number]  Date of birth: 28-Dec-1975 Chief Complaint:   pre op  History of Present Illness:   Cristina Robertson is a 46 y.o. G61P0010 female being seen today for preop for upcoming surgery 9/6.  To review, she has struggled with pelvic pain, AUB and uterine fibroids for many years.  While the [place] did improve her bleeding, she still notes considerable pelvic pain.  Reports both pain on most days that worsens with her period.  She also notes pelvic pressure and bloating.  Minimal improvement with OTC remedies.  Also notes urinary pressure and discomfort.  Desires to proceed with permanent surgical intervention.  Undergoing iron treatment due to iron def. Anemia due to chronic blood loss.  Pt had iron transfusion reaction complicated by MI from epinephrine.  On oral iron therapy only and recent CBC had improved.  Pt is Jehovah's witness- no blood products.  She thinks albumin would be ok, but will do some research on her own.  Last visit, she was not sure about removal of cervix- she desires supracervical hysterectomy.  Patient's last menstrual period was 10/29/2021 (approximate).     09/25/2021   11:05 AM  Depression screen PHQ 2/9  Decreased Interest 0  Down, Depressed, Hopeless 0  PHQ - 2 Score 0  Altered sleeping 2  Tired, decreased energy 2  Change in appetite 2  Feeling bad or failure about yourself  0  Trouble concentrating 1  Moving slowly or fidgety/restless 0  Suicidal thoughts 0  PHQ-9 Score 7     Review of Systems:   Pertinent items are noted in HPI Denies fever/chills, dizziness, headaches, visual disturbances, fatigue, shortness of breath, chest pain Pertinent History Reviewed:  Reviewed past medical,surgical, social, obstetrical and family history.  Reviewed problem list, medications and allergies. Physical Assessment:   Vitals:   11/03/21 1103  BP: 121/74  Pulse: 95  Weight: 184 lb 12.8 oz (83.8 kg)  Height: 5'  10.5" (1.791 m)  Body mass index is 26.14 kg/m.       Physical Examination:   General appearance: alert, well appearing, and in no distress  Psych: mood appropriate, normal affect  Skin: warm & dry   Cardiovascular: normal heart rate noted  Respiratory: normal respiratory effort, no distress  Abdomen: soft, non-tender, enlarged mobile uterus- immediately below umbilicus  Pelvic: normal external genitalia.  On bimanual exam difficult reaching cervix- multi-lobulated enlarged mobile uterus noted  Extremities: no edema, no calf tenderness bilaterally  Chaperone:  pt declined     [place] reviewed: Uterus  Measurements: 9.3 x 6.2 x 7.5 cm = volume: 226 mL. Anteverted. Heterogeneous myometrium. Nabothian cysts at cervix. Masses within uterus consistent with leiomyomata. These include a 2.9 cm subserosal leiomyoma at fundus, 2.8 cm intramural leiomyoma LEFT mid uterus, exophytic anterior wall leiomyoma 3.0 cm, anterior wall 4.0 cm subserosal and a large exophytic fundal leiomyoma 12.5 x 7.8 x 11.3 cm extending superiorly to below the umbilicus.  Assessment & Plan:  1) Uterine leiomyomas, pelvic pain -Reviewed plan for supracervical hysterectomy and bilateral salpingectomy -Discussed risk/benefits including but not limited to risk of bleeding infection and injury to surrounding organs -Discussed postop care- after much discussion, pt is leaning towards overnight stay -discussed home expectations -Questions and concerns were addressed -preop appt scheduled, labs ordered -Jehovah's witness- pt reports that in life-threatening emergency she does not desire any blood products  -pre/postop appts scheduled -majority of today's visit was spent in consultation regarding  the above information  Janyth Pupa, DO Attending [place], [facility] for [employer], [place]

## 2021-11-21 DIAGNOSIS — E039 Hypothyroidism, unspecified: Secondary | ICD-10-CM | POA: Diagnosis not present

## 2021-11-21 DIAGNOSIS — Z833 Family history of diabetes mellitus: Secondary | ICD-10-CM | POA: Diagnosis not present

## 2021-11-21 DIAGNOSIS — R69 Illness, unspecified: Secondary | ICD-10-CM | POA: Diagnosis not present

## 2021-11-21 DIAGNOSIS — Z888 Allergy status to other drugs, medicaments and biological substances status: Secondary | ICD-10-CM | POA: Diagnosis not present

## 2021-11-21 DIAGNOSIS — Z823 Family history of stroke: Secondary | ICD-10-CM | POA: Diagnosis not present

## 2021-11-21 DIAGNOSIS — Z8249 Family history of ischemic heart disease and other diseases of the circulatory system: Secondary | ICD-10-CM | POA: Diagnosis not present

## 2021-11-27 DIAGNOSIS — R0989 Other specified symptoms and signs involving the circulatory and respiratory systems: Secondary | ICD-10-CM | POA: Diagnosis not present

## 2021-11-27 DIAGNOSIS — H9201 Otalgia, right ear: Secondary | ICD-10-CM | POA: Diagnosis not present

## 2021-11-27 DIAGNOSIS — R490 Dysphonia: Secondary | ICD-10-CM | POA: Diagnosis not present

## 2021-11-27 DIAGNOSIS — J312 Chronic pharyngitis: Secondary | ICD-10-CM | POA: Diagnosis not present

## 2021-11-27 DIAGNOSIS — K449 Diaphragmatic hernia without obstruction or gangrene: Secondary | ICD-10-CM | POA: Diagnosis not present

## 2021-11-27 DIAGNOSIS — M26623 Arthralgia of bilateral temporomandibular joint: Secondary | ICD-10-CM | POA: Diagnosis not present

## 2021-11-27 DIAGNOSIS — K219 Gastro-esophageal reflux disease without esophagitis: Secondary | ICD-10-CM | POA: Diagnosis not present

## 2021-11-28 NOTE — Patient Instructions (Addendum)
Cristina Robertson  11/28/2021     '@[email or web address]'$ @   Your procedure is scheduled on  12/03/2021.   Report [facility] at  0600  A.M.   Call this number if you have problems the morning of surgery:  [phone]   Remember:  Do not eat or drink after midnight.      Take these medicines the morning of surgery with A SIP OF WATER                                                  synthroid.     Do not wear jewelry, make-up or nail polish.  Do not wear lotions, powders, or perfumes, or deodorant.  Do not shave 48 hours prior to surgery.  Men may shave face and neck.  Do not bring valuables to the hospital.  [facility] is not responsible for any belongings or valuables.  Contacts, dentures or bridgework may not be worn into surgery.  Leave your suitcase in the car.  After surgery it may be brought to your room.  For patients admitted to the hospital, discharge time will be determined by your treatment team.  Patients discharged the day of surgery will not be allowed to drive home and must have someone with them for 24 hours.    Special instructions:   DO NOT smoke tobacco or vape for 24 hours before your procedure.  Please read over the following fact sheets that you were given. Pain Booklet, Coughing and Deep Breathing, Blood Transfusion Information, Surgical Site Infection Prevention, Anesthesia Post-op Instructions, and Care and Recovery After Surgery         Abdominal Hysterectomy, Care After The following information offers guidance on how to care for yourself after your procedure. Your doctor may also give you more specific instructions. If you have problems or questions, contact your doctor. What can I expect after the procedure? After the procedure, it is common to have: Pain. Tiredness. No desire to eat. Less interest in sex. Bleeding and fluid (discharge) from your vagina. You may need to use a pad after this procedure. Trouble pooping  (constipation). Feelings of sadness or other emotions. Follow these instructions at home: Medicines Take over-the-counter and prescription medicines only as told by your doctor. If you were prescribed an antibiotic medicine, take it as told by your doctor. Do not stop using the antibiotic even if you start to feel better. If told, take steps to prevent problems with pooping (constipation). You may need to: Drink enough fluid to keep your pee (urine) pale yellow. Take medicines. You will be told what medicines to take. Eat foods that are high in fiber. These include beans, whole grains, and fresh fruits and vegetables. Limit foods that are high in fat and sugar. These include fried or sweet foods. Ask your doctor if you should avoid driving or using machines while you are taking your medicine. Surgical cut care  Follow instructions from your doctor about how to take care of your cut from surgery (incision). Make sure you: Wash your hands with soap and water for at least 20 seconds before and after you change your bandage. If you cannot use soap and water, use hand sanitizer. Change your bandage. Leave stitches or skin glue in place for at least two weeks. Leave tape strips alone  unless you are told to take them off. You may trim the edges of the tape strips if they curl up. Keep the bandage dry until your doctor says it can be taken off. Check your incision every day for signs of infection. Check for: More redness, swelling, or pain. Fluid or blood. Warmth. Pus or a bad smell. Activity  Rest as told by your doctor. Get up to take short walks every 1 to 2 hours. Ask for help if you feel weak or unsteady. Do not lift anything that is heavier than 10 lb (4.5 kg), or the limit that you are told. Follow your doctor's advice about exercise, driving, and general activities. Return to your normal activities when your doctor says that it is safe. Lifestyle Do not douche, use tampons, or have  sex for at least 6 weeks or as told by your doctor. Do not drink alcohol until your doctor says it is okay. Do not smoke or use any products that contain nicotine or tobacco. These can delay healing after surgery. If you need help quitting, ask your doctor. General instructions Do not take baths, swim, or use a hot tub. Ask your doctor about taking showers or sponge baths. Try to have a responsible adult at home with you for the first 1-2 weeks to help with your daily chores. Wear tight-fitting (compression) stockings as told by your doctor. Keep all follow-up visits. Contact a doctor if: You have chills or a fever. You have any of these signs of infection around your cut: More redness, swelling, or pain. Fluid or blood. Warmth. Pus or a bad smell. Your cut breaks open. You feel dizzy or light-headed. You have pain or bleeding when you pee. You keep having watery poop (diarrhea). You keep feeling like you may vomit or you keep vomiting. You have fluid coming from your vagina that is not normal. You have any type of reaction to your medicine that is not normal, like a rash, or you develop an allergy to your medicine. Your pain medicine does not help. Get help right away if: You have a fever and your symptoms get worse suddenly. You have very bad pain in your belly (abdomen). You are short of breath. You faint. You have pain, swelling, or redness of your leg. You bleed a lot from your vagina and you see blood clots. Summary It is normal to have some pain, tiredness, and fluid that comes from your vagina. Do not take baths, swim, or use a hot tub. Ask your doctor about taking showers or sponge baths. Do not lift anything that is heavier than 10 lb (4.5 kg), or the limit that you are told. Follow your doctor's advice about exercise, driving, and general activities. Try to have a responsible adult at home with you for the first 1-2 weeks to help with your daily chores. This information  is not intended to replace advice given to you by your health care provider. Make sure you discuss any questions you have with your health care provider. Document Revised: 05/24/2020 Document Reviewed: 11/16/2019 Elsevier Patient Education  [place] Anesthesia, Adult, Care After The following information offers guidance on how to care for yourself after your procedure. Your health care provider may also give you more specific instructions. If you have problems or questions, contact your health care provider. What can I expect after the procedure? After the procedure, it is common for people to: Have pain or discomfort at the IV site. Have nausea  or vomiting. Have a sore throat or hoarseness. Have trouble concentrating. Feel cold or chills. Feel weak, sleepy, or tired (fatigue). Have soreness and body aches. These can affect parts of the body that were not involved in surgery. Follow these instructions at home: For the time period you were told by your health care provider:  Rest. Do not participate in activities where you could fall or become injured. Do not drive or use machinery. Do not drink alcohol. Do not take sleeping pills or medicines that cause drowsiness. Do not make important decisions or sign legal documents. Do not take care of children on your own. General instructions Drink enough fluid to keep your urine pale yellow. If you have sleep apnea, surgery and certain medicines can increase your risk for breathing problems. Follow instructions from your health care provider about wearing your sleep device: Anytime you are sleeping, including during daytime naps. While taking prescription pain medicines, sleeping medicines, or medicines that make you drowsy. Return to your normal activities as told by your health care provider. Ask your health care provider what activities are safe for you. Take over-the-counter and prescription medicines only as told by  your health care provider. Do not use any products that contain nicotine or tobacco. These products include cigarettes, chewing tobacco, and vaping devices, such as e-cigarettes. These can delay incision healing after surgery. If you need help quitting, ask your health care provider. Contact a health care provider if: You have nausea or vomiting that does not get better with medicine. You vomit every time you eat or drink. You have pain that does not get better with medicine. You cannot urinate or have bloody urine. You develop a skin rash. You have a fever. Get help right away if: You have trouble breathing. You have chest pain. You vomit blood. These symptoms may be an emergency. Get help right away. Call 911. Do not wait to see if the symptoms will go away. Do not drive yourself to the hospital. Summary After the procedure, it is common to have a sore throat, hoarseness, nausea, vomiting, or to feel weak, sleepy, or fatigue. For the time period you were told by your health care provider, do not drive or use machinery. Get help right away if you have difficulty breathing, have chest pain, or vomit blood. These symptoms may be an emergency. This information is not intended to replace advice given to you by your health care provider. Make sure you discuss any questions you have with your health care provider. Document Revised: 06/13/2021 Document Reviewed: 06/13/2021 Elsevier Patient Education  [place]. How to Use Chlorhexidine Before Surgery Chlorhexidine gluconate (CHG) is a germ-killing (antiseptic) solution that is used to clean the skin. It can get rid of the bacteria that normally live on the skin and can keep them away for about 24 hours. To clean your skin with CHG, you may be given: A CHG solution to use in the shower or as part of a sponge bath. A prepackaged cloth that contains CHG. Cleaning your skin with CHG may help lower the risk for infection: While you are  staying in the intensive care unit of the hospital. If you have a vascular access, such as a central line, to provide short-term or long-term access to your veins. If you have a catheter to drain urine from your bladder. If you are on a ventilator. A ventilator is a machine that helps you breathe by moving air in and out of your lungs. After  surgery. What are the risks? Risks of using CHG include: A skin reaction. Hearing loss, if CHG gets in your ears and you have a perforated eardrum. Eye injury, if CHG gets in your eyes and is not rinsed out. The CHG product catching fire. Make sure that you avoid smoking and flames after applying CHG to your skin. Do not use CHG: If you have a chlorhexidine allergy or have previously reacted to chlorhexidine. On babies younger than 14 months of age. How to use CHG solution Use CHG only as told by your health care provider, and follow the instructions on the label. Use the full amount of CHG as directed. Usually, this is one bottle. During a shower Follow these steps when using CHG solution during a shower (unless your health care provider gives you different instructions): Start the shower. Use your normal soap and shampoo to wash your face and hair. Turn off the shower or move out of the shower stream. Pour the CHG onto a clean washcloth. Do not use any type of brush or rough-edged sponge. Starting at your neck, lather your body down to your toes. Make sure you follow these instructions: If you will be having surgery, pay special attention to the part of your body where you will be having surgery. Scrub this area for at least 1 minute. Do not use CHG on your head or face. If the solution gets into your ears or eyes, rinse them well with water. Avoid your genital area. Avoid any areas of skin that have broken skin, cuts, or scrapes. Scrub your back and under your arms. Make sure to wash skin folds. Let the lather sit on your skin for 1-2 minutes or  as long as told by your health care provider. Thoroughly rinse your entire body in the shower. Make sure that all body creases and crevices are rinsed well. Dry off with a clean towel. Do not put any substances on your body afterward--such as powder, lotion, or perfume--unless you are told to do so by your health care provider. Only use lotions that are recommended by the manufacturer. Put on clean clothes or pajamas. If it is the night before your surgery, sleep in clean sheets.  During a sponge bath Follow these steps when using CHG solution during a sponge bath (unless your health care provider gives you different instructions): Use your normal soap and shampoo to wash your face and hair. Pour the CHG onto a clean washcloth. Starting at your neck, lather your body down to your toes. Make sure you follow these instructions: If you will be having surgery, pay special attention to the part of your body where you will be having surgery. Scrub this area for at least 1 minute. Do not use CHG on your head or face. If the solution gets into your ears or eyes, rinse them well with water. Avoid your genital area. Avoid any areas of skin that have broken skin, cuts, or scrapes. Scrub your back and under your arms. Make sure to wash skin folds. Let the lather sit on your skin for 1-2 minutes or as long as told by your health care provider. Using a different clean, wet washcloth, thoroughly rinse your entire body. Make sure that all body creases and crevices are rinsed well. Dry off with a clean towel. Do not put any substances on your body afterward--such as powder, lotion, or perfume--unless you are told to do so by your health care provider. Only use lotions that are recommended  by the manufacturer. Put on clean clothes or pajamas. If it is the night before your surgery, sleep in clean sheets. How to use CHG prepackaged cloths Only use CHG cloths as told by your health care provider, and follow the  instructions on the label. Use the CHG cloth on clean, dry skin. Do not use the CHG cloth on your head or face unless your health care provider tells you to. When washing with the CHG cloth: Avoid your genital area. Avoid any areas of skin that have broken skin, cuts, or scrapes. Before surgery Follow these steps when using a CHG cloth to clean before surgery (unless your health care provider gives you different instructions): Using the CHG cloth, vigorously scrub the part of your body where you will be having surgery. Scrub using a back-and-forth motion for 3 minutes. The area on your body should be completely wet with CHG when you are done scrubbing. Do not rinse. Discard the cloth and let the area air-dry. Do not put any substances on the area afterward, such as powder, lotion, or perfume. Put on clean clothes or pajamas. If it is the night before your surgery, sleep in clean sheets.  For general bathing Follow these steps when using CHG cloths for general bathing (unless your health care provider gives you different instructions). Use a separate CHG cloth for each area of your body. Make sure you wash between any folds of skin and between your fingers and toes. Wash your body in the following order, switching to a new cloth after each step: The front of your neck, shoulders, and chest. Both of your arms, under your arms, and your hands. Your stomach and groin area, avoiding the genitals. Your right leg and foot. Your left leg and foot. The back of your neck, your back, and your buttocks. Do not rinse. Discard the cloth and let the area air-dry. Do not put any substances on your body afterward--such as powder, lotion, or perfume--unless you are told to do so by your health care provider. Only use lotions that are recommended by the manufacturer. Put on clean clothes or pajamas. Contact a health care provider if: Your skin gets irritated after scrubbing. You have questions about using  your solution or cloth. You swallow any chlorhexidine. Call your local poison control center (1-802-644-5419 in the [place] Get help right away if: Your eyes itch badly, or they become very red or swollen. Your skin itches badly and is red or swollen. Your hearing changes. You have trouble seeing. You have swelling or tingling in your mouth or throat. You have trouble breathing. These symptoms may represent a serious problem that is an emergency. Do not wait to see if the symptoms will go away. Get medical help right away. Call your local emergency services (911 in the [place] Do not drive yourself to the hospital. Summary Chlorhexidine gluconate (CHG) is a germ-killing (antiseptic) solution that is used to clean the skin. Cleaning your skin with CHG may help to lower your risk for infection. You may be given CHG to use for bathing. It may be in a bottle or in a prepackaged cloth to use on your skin. Carefully follow your health care provider's instructions and the instructions on the product label. Do not use CHG if you have a chlorhexidine allergy. Contact your health care provider if your skin gets irritated after scrubbing. This information is not intended to replace advice given to you by your health care provider. Make sure you discuss any  questions you have with your health care provider. Document Revised: 07/14/2021 Document Reviewed: 05/27/2020 Elsevier Patient Education  [place].

## 2021-11-30 NOTE — H&P (Signed)
Surgery rescheduled.

## 2021-12-02 ENCOUNTER — Encounter: Payer: Self-pay | Admitting: Obstetrics & Gynecology

## 2021-12-02 ENCOUNTER — Encounter (HOSPITAL_COMMUNITY)
Admission: RE | Admit: 2021-12-02 | Discharge: 2021-12-02 | Disposition: A | Discharge status: Home / Self Care | Payer: 59 | Source: Ambulatory Visit | Attending: Obstetrics & Gynecology | Admitting: Obstetrics & Gynecology

## 2021-12-02 DIAGNOSIS — Z01818 Encounter for other preprocedural examination: Secondary | ICD-10-CM

## 2021-12-02 DIAGNOSIS — Z8674 Personal history of sudden cardiac arrest: Secondary | ICD-10-CM

## 2021-12-02 DIAGNOSIS — N939 Abnormal uterine and vaginal bleeding, unspecified: Secondary | ICD-10-CM

## 2021-12-02 DIAGNOSIS — D259 Leiomyoma of uterus, unspecified: Secondary | ICD-10-CM

## 2021-12-03 DIAGNOSIS — Z01818 Encounter for other preprocedural examination: Secondary | ICD-10-CM

## 2021-12-03 DIAGNOSIS — N939 Abnormal uterine and vaginal bleeding, unspecified: Secondary | ICD-10-CM

## 2021-12-03 DIAGNOSIS — D259 Leiomyoma of uterus, unspecified: Secondary | ICD-10-CM

## 2021-12-09 NOTE — Patient Instructions (Signed)
Cristina Robertson  12/09/2021     '@PREFPERIOPPHARMACY'$ @   Your procedure is scheduled on  12/16/2021.   [facility] to [email or web address]   Call this number if you have problems the morning of surgery:  [phone]   Remember:  Do not eat or drink after midnight.      Take these medicines the morning of surgery with A SIP OF WATER                                                      synthroid     Do not wear jewelry, make-up or nail polish.  Do not wear lotions, powders, or perfumes, or deodorant.  Do not shave 48 hours prior to surgery.  Men may shave face and neck.  Do not bring valuables to the hospital.  [facility] is not responsible for any belongings or valuables.  Contacts, dentures or bridgework may not be worn into surgery.  Leave your suitcase in the car.  After surgery it may be brought to your room.  For patients admitted to the hospital, discharge time will be determined by your treatment team.  Patients discharged the day of surgery will not be allowed to drive home and must have someone with them for 24 hours.    Special instructions:   DO NOT smoke tobacco or vape for 24 hors before your procedure.   Please read over the following fact sheets that you were given. Coughing and Deep Breathing, Surgical Site Infection Prevention, Anesthesia Post-op Instructions, and Care and Recovery After Surgery      Abdominal Hysterectomy, Care After The following information offers guidance on how to care for yourself after your procedure. Your doctor may also give you more specific instructions. If you have problems or questions, contact your doctor. What can I expect after the procedure? After the procedure, it is common to have: Pain. Tiredness. No desire to eat. Less interest in sex. Bleeding and fluid (discharge) from your vagina. You may need to use a pad after this procedure. Trouble pooping (constipation). Feelings of sadness or other  emotions. Follow these instructions at home: Medicines Take over-the-counter and prescription medicines only as told by your doctor. If you were prescribed an antibiotic medicine, take it as told by your doctor. Do not stop using the antibiotic even if you start to feel better. If told, take steps to prevent problems with pooping (constipation). You may need to: Drink enough fluid to keep your pee (urine) pale yellow. Take medicines. You will be told what medicines to take. Eat foods that are high in fiber. These include beans, whole grains, and fresh fruits and vegetables. Limit foods that are high in fat and sugar. These include fried or sweet foods. Ask your doctor if you should avoid driving or using machines while you are taking your medicine. Surgical cut care  Follow instructions from your doctor about how to take care of your cut from surgery (incision). Make sure you: Wash your hands with soap and water for at least 20 seconds before and after you change your bandage. If you cannot use soap and water, use hand sanitizer. Change your bandage. Leave stitches or skin glue in place for at least two weeks. Leave tape strips alone unless  you are told to take them off. You may trim the edges of the tape strips if they curl up. Keep the bandage dry until your doctor says it can be taken off. Check your incision every day for signs of infection. Check for: More redness, swelling, or pain. Fluid or blood. Warmth. Pus or a bad smell. Activity  Rest as told by your doctor. Get up to take short walks every 1 to 2 hours. Ask for help if you feel weak or unsteady. Do not lift anything that is heavier than 10 lb (4.5 kg), or the limit that you are told. Follow your doctor's advice about exercise, driving, and general activities. Return to your normal activities when your doctor says that it is safe. Lifestyle Do not douche, use tampons, or have sex for at least 6 weeks or as told by your  doctor. Do not drink alcohol until your doctor says it is okay. Do not smoke or use any products that contain nicotine or tobacco. These can delay healing after surgery. If you need help quitting, ask your doctor. General instructions Do not take baths, swim, or use a hot tub. Ask your doctor about taking showers or sponge baths. Try to have a responsible adult at home with you for the first 1-2 weeks to help with your daily chores. Wear tight-fitting (compression) stockings as told by your doctor. Keep all follow-up visits. Contact a doctor if: You have chills or a fever. You have any of these signs of infection around your cut: More redness, swelling, or pain. Fluid or blood. Warmth. Pus or a bad smell. Your cut breaks open. You feel dizzy or light-headed. You have pain or bleeding when you pee. You keep having watery poop (diarrhea). You keep feeling like you may vomit or you keep vomiting. You have fluid coming from your vagina that is not normal. You have any type of reaction to your medicine that is not normal, like a rash, or you develop an allergy to your medicine. Your pain medicine does not help. Get help right away if: You have a fever and your symptoms get worse suddenly. You have very bad pain in your belly (abdomen). You are short of breath. You faint. You have pain, swelling, or redness of your leg. You bleed a lot from your vagina and you see blood clots. Summary It is normal to have some pain, tiredness, and fluid that comes from your vagina. Do not take baths, swim, or use a hot tub. Ask your doctor about taking showers or sponge baths. Do not lift anything that is heavier than 10 lb (4.5 kg), or the limit that you are told. Follow your doctor's advice about exercise, driving, and general activities. Try to have a responsible adult at home with you for the first 1-2 weeks to help with your daily chores. This information is not intended to replace advice given to  you by your health care provider. Make sure you discuss any questions you have with your health care provider. Document Revised: 05/24/2020 Document Reviewed: 11/16/2019 Elsevier Patient Education  [place] Anesthesia, Adult, Care After The following information offers guidance on how to care for yourself after your procedure. Your health care provider may also give you more specific instructions. If you have problems or questions, contact your health care provider. What can I expect after the procedure? After the procedure, it is common for people to: Have pain or discomfort at the IV site. Have nausea or vomiting.  Have a sore throat or hoarseness. Have trouble concentrating. Feel cold or chills. Feel weak, sleepy, or tired (fatigue). Have soreness and body aches. These can affect parts of the body that were not involved in surgery. Follow these instructions at home: For the time period you were told by your health care provider:  Rest. Do not participate in activities where you could fall or become injured. Do not drive or use machinery. Do not drink alcohol. Do not take sleeping pills or medicines that cause drowsiness. Do not make important decisions or sign legal documents. Do not take care of children on your own. General instructions Drink enough fluid to keep your urine pale yellow. If you have sleep apnea, surgery and certain medicines can increase your risk for breathing problems. Follow instructions from your health care provider about wearing your sleep device: Anytime you are sleeping, including during daytime naps. While taking prescription pain medicines, sleeping medicines, or medicines that make you drowsy. Return to your normal activities as told by your health care provider. Ask your health care provider what activities are safe for you. Take over-the-counter and prescription medicines only as told by your health care provider. Do not use any  products that contain nicotine or tobacco. These products include cigarettes, chewing tobacco, and vaping devices, such as e-cigarettes. These can delay incision healing after surgery. If you need help quitting, ask your health care provider. Contact a health care provider if: You have nausea or vomiting that does not get better with medicine. You vomit every time you eat or drink. You have pain that does not get better with medicine. You cannot urinate or have bloody urine. You develop a skin rash. You have a fever. Get help right away if: You have trouble breathing. You have chest pain. You vomit blood. These symptoms may be an emergency. Get help right away. Call 911. Do not wait to see if the symptoms will go away. Do not drive yourself to the hospital. Summary After the procedure, it is common to have a sore throat, hoarseness, nausea, vomiting, or to feel weak, sleepy, or fatigue. For the time period you were told by your health care provider, do not drive or use machinery. Get help right away if you have difficulty breathing, have chest pain, or vomit blood. These symptoms may be an emergency. This information is not intended to replace advice given to you by your health care provider. Make sure you discuss any questions you have with your health care provider. Document Revised: 06/13/2021 Document Reviewed: 06/13/2021 Elsevier Patient Education  [place]. How to Use Chlorhexidine Before Surgery Chlorhexidine gluconate (CHG) is a germ-killing (antiseptic) solution that is used to clean the skin. It can get rid of the bacteria that normally live on the skin and can keep them away for about 24 hours. To clean your skin with CHG, you may be given: A CHG solution to use in the shower or as part of a sponge bath. A prepackaged cloth that contains CHG. Cleaning your skin with CHG may help lower the risk for infection: While you are staying in the intensive care unit of the  hospital. If you have a vascular access, such as a central line, to provide short-term or long-term access to your veins. If you have a catheter to drain urine from your bladder. If you are on a ventilator. A ventilator is a machine that helps you breathe by moving air in and out of your lungs. After surgery. What  are the risks? Risks of using CHG include: A skin reaction. Hearing loss, if CHG gets in your ears and you have a perforated eardrum. Eye injury, if CHG gets in your eyes and is not rinsed out. The CHG product catching fire. Make sure that you avoid smoking and flames after applying CHG to your skin. Do not use CHG: If you have a chlorhexidine allergy or have previously reacted to chlorhexidine. On babies younger than 78 months of age. How to use CHG solution Use CHG only as told by your health care provider, and follow the instructions on the label. Use the full amount of CHG as directed. Usually, this is one bottle. During a shower Follow these steps when using CHG solution during a shower (unless your health care provider gives you different instructions): Start the shower. Use your normal soap and shampoo to wash your face and hair. Turn off the shower or move out of the shower stream. Pour the CHG onto a clean washcloth. Do not use any type of brush or rough-edged sponge. Starting at your neck, lather your body down to your toes. Make sure you follow these instructions: If you will be having surgery, pay special attention to the part of your body where you will be having surgery. Scrub this area for at least 1 minute. Do not use CHG on your head or face. If the solution gets into your ears or eyes, rinse them well with water. Avoid your genital area. Avoid any areas of skin that have broken skin, cuts, or scrapes. Scrub your back and under your arms. Make sure to wash skin folds. Let the lather sit on your skin for 1-2 minutes or as long as told by your health care  provider. Thoroughly rinse your entire body in the shower. Make sure that all body creases and crevices are rinsed well. Dry off with a clean towel. Do not put any substances on your body afterward--such as powder, lotion, or perfume--unless you are told to do so by your health care provider. Only use lotions that are recommended by the manufacturer. Put on clean clothes or pajamas. If it is the night before your surgery, sleep in clean sheets.  During a sponge bath Follow these steps when using CHG solution during a sponge bath (unless your health care provider gives you different instructions): Use your normal soap and shampoo to wash your face and hair. Pour the CHG onto a clean washcloth. Starting at your neck, lather your body down to your toes. Make sure you follow these instructions: If you will be having surgery, pay special attention to the part of your body where you will be having surgery. Scrub this area for at least 1 minute. Do not use CHG on your head or face. If the solution gets into your ears or eyes, rinse them well with water. Avoid your genital area. Avoid any areas of skin that have broken skin, cuts, or scrapes. Scrub your back and under your arms. Make sure to wash skin folds. Let the lather sit on your skin for 1-2 minutes or as long as told by your health care provider. Using a different clean, wet washcloth, thoroughly rinse your entire body. Make sure that all body creases and crevices are rinsed well. Dry off with a clean towel. Do not put any substances on your body afterward--such as powder, lotion, or perfume--unless you are told to do so by your health care provider. Only use lotions that are recommended by the  manufacturer. Put on clean clothes or pajamas. If it is the night before your surgery, sleep in clean sheets. How to use CHG prepackaged cloths Only use CHG cloths as told by your health care provider, and follow the instructions on the label. Use the  CHG cloth on clean, dry skin. Do not use the CHG cloth on your head or face unless your health care provider tells you to. When washing with the CHG cloth: Avoid your genital area. Avoid any areas of skin that have broken skin, cuts, or scrapes. Before surgery Follow these steps when using a CHG cloth to clean before surgery (unless your health care provider gives you different instructions): Using the CHG cloth, vigorously scrub the part of your body where you will be having surgery. Scrub using a back-and-forth motion for 3 minutes. The area on your body should be completely wet with CHG when you are done scrubbing. Do not rinse. Discard the cloth and let the area air-dry. Do not put any substances on the area afterward, such as powder, lotion, or perfume. Put on clean clothes or pajamas. If it is the night before your surgery, sleep in clean sheets.  For general bathing Follow these steps when using CHG cloths for general bathing (unless your health care provider gives you different instructions). Use a separate CHG cloth for each area of your body. Make sure you wash between any folds of skin and between your fingers and toes. Wash your body in the following order, switching to a new cloth after each step: The front of your neck, shoulders, and chest. Both of your arms, under your arms, and your hands. Your stomach and groin area, avoiding the genitals. Your right leg and foot. Your left leg and foot. The back of your neck, your back, and your buttocks. Do not rinse. Discard the cloth and let the area air-dry. Do not put any substances on your body afterward--such as powder, lotion, or perfume--unless you are told to do so by your health care provider. Only use lotions that are recommended by the manufacturer. Put on clean clothes or pajamas. Contact a health care provider if: Your skin gets irritated after scrubbing. You have questions about using your solution or cloth. You swallow  any chlorhexidine. Call your local poison control center (1-(774)622-1811 in the [place] Get help right away if: Your eyes itch badly, or they become very red or swollen. Your skin itches badly and is red or swollen. Your hearing changes. You have trouble seeing. You have swelling or tingling in your mouth or throat. You have trouble breathing. These symptoms may represent a serious problem that is an emergency. Do not wait to see if the symptoms will go away. Get medical help right away. Call your local emergency services (911 in the [place] Do not drive yourself to the hospital. Summary Chlorhexidine gluconate (CHG) is a germ-killing (antiseptic) solution that is used to clean the skin. Cleaning your skin with CHG may help to lower your risk for infection. You may be given CHG to use for bathing. It may be in a bottle or in a prepackaged cloth to use on your skin. Carefully follow your health care provider's instructions and the instructions on the product label. Do not use CHG if you have a chlorhexidine allergy. Contact your health care provider if your skin gets irritated after scrubbing. This information is not intended to replace advice given to you by your health care provider. Make sure you discuss any questions you  have with your health care provider. Document Revised: 07/14/2021 Document Reviewed: 05/27/2020 Elsevier Patient Education  [place].

## 2021-12-10 ENCOUNTER — Encounter: Payer: 59 | Admitting: Obstetrics & Gynecology

## 2021-12-11 ENCOUNTER — Encounter (HOSPITAL_COMMUNITY): Payer: Self-pay

## 2021-12-11 ENCOUNTER — Encounter (HOSPITAL_COMMUNITY)
Admission: RE | Admit: 2021-12-11 | Discharge: 2021-12-11 | Disposition: A | Discharge status: Home / Self Care | Payer: 59 | Source: Ambulatory Visit | Attending: Obstetrics & Gynecology | Admitting: Obstetrics & Gynecology

## 2021-12-11 VITALS — BP 134/84 | HR 88 | Temp 97.8°F | Resp 18 | Ht 70.5 in | Wt 184.7 lb

## 2021-12-11 DIAGNOSIS — I2510 Atherosclerotic heart disease of native coronary artery without angina pectoris: Secondary | ICD-10-CM

## 2021-12-11 DIAGNOSIS — Z01818 Encounter for other preprocedural examination: Secondary | ICD-10-CM | POA: Diagnosis not present

## 2021-12-11 LAB — CBC WITH DIFFERENTIAL/PLATELET
Abs Immature Granulocytes: 0.01 10*3/uL (ref 0.00–0.07)
Basophils Absolute: 0 10*3/uL (ref 0.0–0.1)
Basophils Relative: 1 %
Eosinophils Absolute: 0.1 10*3/uL (ref 0.0–0.5)
Eosinophils Relative: 1 %
HCT: 38 % (ref 36.0–46.0)
Hemoglobin: 12.6 g/dL (ref 12.0–15.0)
Immature Granulocytes: 0 %
Lymphocytes Relative: 30 %
Lymphs Abs: 1.4 10*3/uL (ref 0.7–4.0)
MCH: 30.4 pg (ref 26.0–34.0)
MCHC: 33.2 g/dL (ref 30.0–36.0)
MCV: 91.8 fL (ref 80.0–100.0)
Monocytes Absolute: 0.4 10*3/uL (ref 0.1–1.0)
Monocytes Relative: 9 %
Neutro Abs: 2.7 10*3/uL (ref 1.7–7.7)
Neutrophils Relative %: 59 %
Platelets: 197 10*3/uL (ref 150–400)
RBC: 4.14 MIL/uL (ref 3.87–5.11)
RDW: 12 % (ref 11.5–15.5)
WBC: 4.6 10*3/uL (ref 4.0–10.5)
nRBC: 0 % (ref 0.0–0.2)

## 2021-12-11 LAB — POCT PREGNANCY, URINE: Preg Test, Ur: NEGATIVE

## 2021-12-14 NOTE — H&P (Signed)
[facility] Attending History and Physical  Cristina Robertson is a 46 y.o. G1P0010 who presents for scheduled supracervical hysterectomy.  In review, she has a long standing history of pelvic pain, AUB and uterine fibroids.  While the [place] did improve her bleeding, she continues to note significant pelvic pain, bloating and pressure.  She reports minimal improvement with OTC remedies.  She desires to proceed with permanent surgical intervention.  Of note, due to iron def. Anemia pt did previously requiring iron transfusion.  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  Pt is a Jehovah's witness and will not accept blood transfusions.    Denies any abnormal vaginal discharge, fevers, chills, sweats, dysuria, nausea, vomiting, other GI or GU symptoms or other general symptoms. 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  No current facility-administered medications on file prior to encounter.   Current Outpatient Medications on File Prior to Encounter  Medication Sig Dispense Refill   levothyroxine (SYNTHROID) 50 MCG tablet Take 50 mcg by mouth daily before breakfast.     niacin 100 MG tablet Take 100 mg by mouth at bedtime.     OVER THE COUNTER MEDICATION Take 1 tablet by mouth daily. Vitamin A, D & K     Iron-FA-B Cmp-C-Biot-Probiotic (FUSION PLUS) CAPS Take 1 capsule by mouth every other day. (Patient not taking: Reported on 11/03/2021)  30 capsule 3   Allergies  Allergen Reactions   Contrast Media [Iodinated Contrast Media] Other (See Comments)    She stated that last time she had IV contrast in the [place] she states that it caused "convulsions" and LOC.  She states that when she woke up in the ED she was unable to move for the rest of the day, but the next day she felt "fine".   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 She has never used smokeless tobacco. She reports that she does not drink alcohol and does not use drugs. Family History  Problem Relation Age of Onset   Hypertension Mother    Diabetes Mother    Stroke Mother     Review of Systems: Pertinent items noted in HPI and remainder of comprehensive ROS otherwise negative.  PHYSICAL EXAM: *** CONSTITUTIONAL: Well-developed, well-nourished female in no acute distress.  SKIN: Skin is warm and dry. No rash noted. Not diaphoretic. No erythema. No pallor. NEUROLOGIC: Alert and oriented to person, place, and time. Normal reflexes, muscle tone coordination. No cranial nerve  deficit noted. PSYCHIATRIC: Normal mood and affect. Normal behavior. Normal judgment and thought content. CARDIOVASCULAR: Normal heart rate noted, regular rhythm RESPIRATORY: Effort and breath sounds normal, no problems with respiration noted ABDOMEN: Soft, nontender, nondistended. PELVIC: deferred MUSCULOSKELETAL: no calf tenderness bilaterally EXT: no edema bilaterally, normal pulses  Labs: Results for orders placed or performed during the hospital encounter of 12/11/21 (from the past 336 hour(s))  CBC with Differential/Platelet   Collection Time: 12/11/21 12:50 PM  Result Value Ref Range   WBC 4.6 4.0 - 10.5 K/uL   RBC 4.14 3.87 - 5.11 MIL/uL    Hemoglobin 12.6 12.0 - 15.0 g/dL   HCT 38.0 36.0 - 46.0 %   MCV 91.8 80.0 - 100.0 fL   MCH 30.4 26.0 - 34.0 pg   MCHC 33.2 30.0 - 36.0 g/dL   RDW 12.0 11.5 - 15.5 %   Platelets 197 150 - 400 K/uL   nRBC 0.0 0.0 - 0.2 %   Neutrophils Relative % 59 %   Neutro Abs 2.7 1.7 - 7.7 K/uL   Lymphocytes Relative 30 %   Lymphs Abs 1.4 0.7 - 4.0 K/uL   Monocytes Relative 9 %   Monocytes Absolute 0.4 0.1 - 1.0 K/uL   Eosinophils Relative 1 %   Eosinophils Absolute 0.1 0.0 - 0.5 K/uL   Basophils Relative 1 %   Basophils Absolute 0.0 0.0 - 0.1 K/uL   Immature Granulocytes 0 %   Abs Immature Granulocytes 0.01 0.00 - 0.07 K/uL  Pregnancy, urine POC   Collection Time: 12/11/21  1:10 PM  Result Value Ref Range   Preg Test, Ur NEGATIVE NEGATIVE    Imaging Studies: 10/01/2021:  Measurements: 9.3 x 6.2 x 7.5 cm = volume: 226 mL. Anteverted. Heterogeneous myometrium. Nabothian cysts at cervix. Masses within uterus consistent with leiomyomata. These include a 2.9 cm subserosal leiomyoma at fundus, 2.8 cm intramural leiomyoma LEFT mid uterus, exophytic anterior wall leiomyoma 3.0 cm, anterior wall 4.0 cm subserosal and a large exophytic fundal leiomyoma 12.5 x 7.8 x 11.3 cm extending superiorly to below the umbilicus.   Assessment: Uterine fibroids Pelvic pain   Plan: Supracervical hysterectomy and bilateral salpingectomy -NPO -LR @ 125cc/hr -SCDs to OR -Toradol pre-op  -Risk/benefits and alternatives reviewed with the patient including but not limited to risk of bleeding, infection and injury to surrounding organs.  Questions and concerns were addressed and pt desires to proceed  Janyth Pupa, DO Attending [place], [facility] for [facility], [place]

## 2021-12-16 ENCOUNTER — Other Ambulatory Visit: Payer: Self-pay

## 2021-12-16 ENCOUNTER — Encounter (HOSPITAL_COMMUNITY)
Admission: RE | Disposition: A | Discharge status: Home / Self Care | Payer: Self-pay | Source: Home / Self Care | Attending: Obstetrics & Gynecology

## 2021-12-16 ENCOUNTER — Encounter (HOSPITAL_COMMUNITY): Payer: Self-pay | Admitting: Obstetrics & Gynecology

## 2021-12-16 ENCOUNTER — Inpatient Hospital Stay (HOSPITAL_COMMUNITY): Payer: 59 | Admitting: Anesthesiology

## 2021-12-16 ENCOUNTER — Observation Stay (HOSPITAL_COMMUNITY)
Admission: RE | Admit: 2021-12-16 | Discharge: 2021-12-17 | Disposition: A | Discharge status: Home / Self Care | Payer: 59 | Attending: Obstetrics & Gynecology | Admitting: Obstetrics & Gynecology

## 2021-12-16 DIAGNOSIS — N939 Abnormal uterine and vaginal bleeding, unspecified: Secondary | ICD-10-CM | POA: Diagnosis present

## 2021-12-16 DIAGNOSIS — D259 Leiomyoma of uterus, unspecified: Secondary | ICD-10-CM

## 2021-12-16 DIAGNOSIS — Z79899 Other long term (current) drug therapy: Secondary | ICD-10-CM | POA: Insufficient documentation

## 2021-12-16 DIAGNOSIS — D251 Intramural leiomyoma of uterus: Secondary | ICD-10-CM | POA: Diagnosis not present

## 2021-12-16 DIAGNOSIS — D252 Subserosal leiomyoma of uterus: Secondary | ICD-10-CM | POA: Diagnosis not present

## 2021-12-16 DIAGNOSIS — E039 Hypothyroidism, unspecified: Secondary | ICD-10-CM | POA: Insufficient documentation

## 2021-12-16 DIAGNOSIS — R102 Pelvic and perineal pain unspecified side: Secondary | ICD-10-CM

## 2021-12-16 DIAGNOSIS — Z01818 Encounter for other preprocedural examination: Secondary | ICD-10-CM

## 2021-12-16 DIAGNOSIS — D250 Submucous leiomyoma of uterus: Secondary | ICD-10-CM | POA: Diagnosis not present

## 2021-12-16 DIAGNOSIS — N831 Corpus luteum cyst of ovary, unspecified side: Secondary | ICD-10-CM | POA: Diagnosis not present

## 2021-12-16 HISTORY — PX: ABDOMINAL HYSTERECTOMY: SHX81

## 2021-12-16 SURGERY — HYSTERECTOMY, ABDOMINAL
Anesthesia: General | Site: Abdomen

## 2021-12-16 MED ORDER — FENTANYL CITRATE PF 50 MCG/ML IJ SOSY
25.0000 ug | PREFILLED_SYRINGE | INTRAMUSCULAR | Status: DC | PRN
  Administered 2021-12-16: 50 ug via INTRAVENOUS
  Filled 2021-12-16: qty 1

## 2021-12-16 MED ORDER — HEMOSTATIC AGENTS (NO CHARGE) OPTIME
TOPICAL | Status: DC | PRN
  Administered 2021-12-16: 1 via TOPICAL

## 2021-12-16 MED ORDER — CEFAZOLIN SODIUM-DEXTROSE 2-4 GM/100ML-% IV SOLN
2.0000 g | INTRAVENOUS | Status: AC
  Administered 2021-12-16: 2 g via INTRAVENOUS

## 2021-12-16 MED ORDER — ALUM & MAG HYDROXIDE-SIMETH 200-200-20 MG/5ML PO SUSP: 30.0000 mL | ORAL | Status: DC | PRN

## 2021-12-16 MED ORDER — SUGAMMADEX SODIUM 500 MG/5ML IV SOLN
INTRAVENOUS | Status: DC | PRN
  Administered 2021-12-16: 200 mg via INTRAVENOUS

## 2021-12-16 MED ORDER — CHLORHEXIDINE GLUCONATE CLOTH 2 % EX PADS
6.0000 | MEDICATED_PAD | Freq: Every day | CUTANEOUS | Status: DC
  Administered 2021-12-17: 6 via TOPICAL

## 2021-12-16 MED ORDER — ACETAMINOPHEN 325 MG PO TABS: 650.0000 mg | ORAL_TABLET | Freq: Four times a day (QID) | ORAL | Status: DC | PRN

## 2021-12-16 MED ORDER — KETOROLAC TROMETHAMINE 30 MG/ML IJ SOLN: 30.0000 mg | Freq: Four times a day (QID) | INTRAMUSCULAR | Status: DC

## 2021-12-16 MED ORDER — LACTATED RINGERS IV SOLN: INTRAVENOUS | Status: DC

## 2021-12-16 MED ORDER — POVIDONE-IODINE 10 % EX SWAB: 2.0000 | Freq: Once | CUTANEOUS | Status: DC

## 2021-12-16 MED ORDER — OXYCODONE HCL 5 MG/5ML PO SOLN: 5.0000 mg | Freq: Once | ORAL | Status: DC | PRN

## 2021-12-16 MED ORDER — LIDOCAINE HCL (CARDIAC) PF 50 MG/5ML IV SOSY
PREFILLED_SYRINGE | INTRAVENOUS | Status: DC | PRN
  Administered 2021-12-16: 80 mg via INTRAVENOUS

## 2021-12-16 MED ORDER — OXYCODONE HCL 5 MG PO TABS
5.0000 mg | ORAL_TABLET | ORAL | Status: DC | PRN
  Administered 2021-12-16 (×2): 5 mg via ORAL
  Filled 2021-12-16 (×2): qty 1

## 2021-12-16 MED ORDER — ORAL CARE MOUTH RINSE: 15.0000 mL | OROMUCOSAL | Status: DC | PRN

## 2021-12-16 MED ORDER — BUPIVACAINE-MELOXICAM ER 400-12 MG/14ML IJ SOLN
INTRAMUSCULAR | Status: DC | PRN
  Administered 2021-12-16: 300 mg

## 2021-12-16 MED ORDER — ONDANSETRON HCL 4 MG/2ML IJ SOLN
4.0000 mg | Freq: Four times a day (QID) | INTRAMUSCULAR | Status: DC | PRN
  Administered 2021-12-16: 4 mg via INTRAVENOUS
  Filled 2021-12-16: qty 2

## 2021-12-16 MED ORDER — ONDANSETRON HCL 4 MG/2ML IJ SOLN
INTRAMUSCULAR | Status: DC | PRN
  Administered 2021-12-16: 4 mg via INTRAVENOUS

## 2021-12-16 MED ORDER — CHLORHEXIDINE GLUCONATE 0.12 % MT SOLN: 15.0000 mL | Freq: Once | OROMUCOSAL | Status: DC

## 2021-12-16 MED ORDER — ONDANSETRON HCL 4 MG/2ML IJ SOLN
4.0000 mg | Freq: Once | INTRAMUSCULAR | Status: AC | PRN
  Administered 2021-12-16: 4 mg via INTRAVENOUS
  Filled 2021-12-16: qty 2

## 2021-12-16 MED ORDER — FENTANYL CITRATE (PF) 100 MCG/2ML IJ SOLN
INTRAMUSCULAR | Status: DC | PRN
  Administered 2021-12-16: 50 ug via INTRAVENOUS
  Administered 2021-12-16 (×3): 100 ug via INTRAVENOUS

## 2021-12-16 MED ORDER — KETOROLAC TROMETHAMINE 15 MG/ML IJ SOLN: 30.0000 mg | INTRAMUSCULAR | Status: DC

## 2021-12-16 MED ORDER — DOCUSATE SODIUM 100 MG PO CAPS
100.0000 mg | ORAL_CAPSULE | Freq: Two times a day (BID) | ORAL | Status: DC
  Administered 2021-12-16 – 2021-12-17 (×3): 100 mg via ORAL
  Filled 2021-12-16 (×3): qty 1

## 2021-12-16 MED ORDER — KETOROLAC TROMETHAMINE 30 MG/ML IJ SOLN
30.0000 mg | Freq: Four times a day (QID) | INTRAMUSCULAR | Status: DC
  Administered 2021-12-16 – 2021-12-17 (×4): 30 mg via INTRAVENOUS
  Filled 2021-12-16 (×4): qty 1

## 2021-12-16 MED ORDER — OXYCODONE HCL 5 MG PO TABS: 5.0000 mg | ORAL_TABLET | Freq: Once | ORAL | Status: DC | PRN

## 2021-12-16 MED ORDER — 0.9 % SODIUM CHLORIDE (POUR BTL) OPTIME
TOPICAL | Status: DC | PRN
  Administered 2021-12-16 (×3): 1000 mL

## 2021-12-16 MED ORDER — BUPIVACAINE-MELOXICAM ER 200-6 MG/7ML IJ SOLN
INTRAMUSCULAR | Status: AC
  Filled 2021-12-16: qty 1

## 2021-12-16 MED ORDER — PROPOFOL 10 MG/ML IV BOLUS
INTRAVENOUS | Status: DC | PRN
  Administered 2021-12-16: 20 mg via INTRAVENOUS
  Administered 2021-12-16: 180 mg via INTRAVENOUS

## 2021-12-16 MED ORDER — GABAPENTIN 300 MG PO CAPS
300.0000 mg | ORAL_CAPSULE | Freq: Three times a day (TID) | ORAL | Status: DC
  Administered 2021-12-16 (×3): 300 mg via ORAL
  Filled 2021-12-16 (×4): qty 1

## 2021-12-16 MED ORDER — MIDAZOLAM HCL 2 MG/2ML IJ SOLN
INTRAMUSCULAR | Status: DC | PRN
  Administered 2021-12-16: 2 mg via INTRAVENOUS

## 2021-12-16 MED ORDER — KETOROLAC TROMETHAMINE 30 MG/ML IJ SOLN
INTRAMUSCULAR | Status: AC
  Administered 2021-12-16: 30 mg
  Filled 2021-12-16: qty 1

## 2021-12-16 MED ORDER — ROCURONIUM BROMIDE 100 MG/10ML IV SOLN
INTRAVENOUS | Status: DC | PRN
  Administered 2021-12-16: 70 mg via INTRAVENOUS

## 2021-12-16 MED ORDER — CEFAZOLIN SODIUM-DEXTROSE 2-4 GM/100ML-% IV SOLN
INTRAVENOUS | Status: AC
  Filled 2021-12-16: qty 100

## 2021-12-16 MED ORDER — DEXAMETHASONE SODIUM PHOSPHATE 10 MG/ML IJ SOLN
INTRAMUSCULAR | Status: DC | PRN
  Administered 2021-12-16: 5 mg via INTRAVENOUS

## 2021-12-16 MED ORDER — SIMETHICONE 80 MG PO CHEW: 80.0000 mg | CHEWABLE_TABLET | Freq: Four times a day (QID) | ORAL | Status: DC | PRN

## 2021-12-16 MED ORDER — ONDANSETRON HCL 4 MG PO TABS: 4.0000 mg | ORAL_TABLET | Freq: Four times a day (QID) | ORAL | Status: DC | PRN

## 2021-12-16 MED ORDER — MENTHOL 3 MG MT LOZG: 1.0000 | LOZENGE | OROMUCOSAL | Status: DC | PRN

## 2021-12-16 MED ORDER — ORAL CARE MOUTH RINSE: 15.0000 mL | Freq: Once | OROMUCOSAL | Status: DC

## 2021-12-16 SURGICAL SUPPLY — 42 items
APPLICATOR COTTON TIP 6 STRL (MISCELLANEOUS) ×1 IMPLANT
APPLICATOR COTTON TIP 6IN STRL (MISCELLANEOUS) ×1 IMPLANT
BLADE SURG SZ10 CARB STEEL (BLADE) IMPLANT
CLOTH BEACON ORANGE TIMEOUT ST (SAFETY) ×1 IMPLANT
COVER SURGICAL LIGHT HANDLE (MISCELLANEOUS) ×2 IMPLANT
DERMABOND ADVANCED .7 DNX12 (GAUZE/BANDAGES/DRESSINGS) ×1 IMPLANT
DRAPE HALF SHEET 40X57 (DRAPES) IMPLANT
DRAPE WARM FLUID 44X44 (DRAPES) IMPLANT
DRSG OPSITE POSTOP 4X8 (GAUZE/BANDAGES/DRESSINGS) IMPLANT
DURAPREP 26ML APPLICATOR (WOUND CARE) ×1 IMPLANT
GAUZE 4X4 16PLY ~~LOC~~+RFID DBL (SPONGE) ×2 IMPLANT
GLOVE BIO SURGEON STRL SZ 6.5 (GLOVE) ×1 IMPLANT
GLOVE BIOGEL PI IND STRL 7.0 (GLOVE) ×3 IMPLANT
GLOVE BIOGEL PI IND STRL 8 (GLOVE) IMPLANT
GLOVE ECLIPSE 8.0 STRL XLNG CF (GLOVE) IMPLANT
GLOVE SURG SS PI 7.0 STRL IVOR (GLOVE) IMPLANT
GLOVE SURG SS PI 7.5 STRL IVOR (GLOVE) IMPLANT
GOWN STRL REUS W/ TWL LRG LVL3 (GOWN DISPOSABLE) ×1 IMPLANT
GOWN STRL REUS W/TWL LRG LVL3 (GOWN DISPOSABLE) ×2 IMPLANT
GOWN STRL REUS W/TWL XL LVL3 (GOWN DISPOSABLE) ×1 IMPLANT
KIT BLADEGUARD II DBL (SET/KITS/TRAYS/PACK) ×1 IMPLANT
KIT TURNOVER KIT A (KITS) ×2 IMPLANT
NS IRRIG 1000ML POUR BTL (IV SOLUTION) ×1 IMPLANT
PACK ABDOMINAL MAJOR (CUSTOM PROCEDURE TRAY) ×1 IMPLANT
PAD ARMBOARD 7.5X6 YLW CONV (MISCELLANEOUS) ×2 IMPLANT
POWDER SURGICEL 3.0 GRAM (HEMOSTASIS) IMPLANT
RETRACTOR WOUND ALXS 18CM MED (MISCELLANEOUS) ×1 IMPLANT
RTRCTR WOUND ALEXIS O 18CM MED (MISCELLANEOUS) ×1
SET BASIN LINEN APH (SET/KITS/TRAYS/PACK) ×2 IMPLANT
SHEARS HARMONIC 9CM CVD (BLADE) IMPLANT
SOL PREP POV-IOD 4OZ 10% (MISCELLANEOUS) ×1 IMPLANT
SPONGE T-LAP 18X18 ~~LOC~~+RFID (SPONGE) ×2 IMPLANT
SUT CHROMIC 2 0 CT 1 (SUTURE) ×2 IMPLANT
SUT MNCRL AB 4-0 PS2 18 (SUTURE) IMPLANT
SUT VIC AB 0 CT1 27 (SUTURE) ×2
SUT VIC AB 0 CT1 27XCR 8 STRN (SUTURE) ×2 IMPLANT
SUT VIC AB 0 CT1 36 (SUTURE) ×1 IMPLANT
SUT VICRYL 3 0 (SUTURE) ×1 IMPLANT
SYR 10ML LL (SYRINGE) IMPLANT
TOWEL ~~LOC~~+RFID 17X26 BLUE (SPONGE) ×1 IMPLANT
TRAY FOLEY W/BAG SLVR 16FR (SET/KITS/TRAYS/PACK) ×1
TRAY FOLEY W/BAG SLVR 16FR ST (SET/KITS/TRAYS/PACK) ×1 IMPLANT

## 2021-12-16 NOTE — Anesthesia Postprocedure Evaluation (Signed)
Anesthesia Post Note  Patient: Cristina Robertson  Procedure(s) Performed: HYSTERECTOMY ABDOMINAL SUPRACERVICAL WITH BILATERAL SALPINGECTOMY (Abdomen)  Patient location during evaluation: Phase II Anesthesia Type: General Level of consciousness: awake Pain management: pain level controlled Vital Signs Assessment: post-procedure vital signs reviewed and stable Respiratory status: spontaneous breathing and respiratory function stable Cardiovascular status: blood pressure returned to baseline and stable Postop Assessment: no headache and no apparent nausea or vomiting Anesthetic complications: no Comments: Late entry   No notable events documented.   Last Vitals:  Vitals:   12/16/21 1121 12/16/21 1242  BP: (!) 145/82 (!) 141/81  Pulse: 70 78  Resp: 20 20  Temp: (!) 36.3 C 36.8 C  SpO2: 100% 100%    Last Pain:  Vitals:   12/16/21 1242  TempSrc: Oral  PainSc:                  Louann Sjogren

## 2021-12-16 NOTE — Transfer of Care (Signed)
Immediate Anesthesia Transfer of Care Note  Patient: Cristina Robertson  Procedure(s) Performed: HYSTERECTOMY ABDOMINAL SUPRACERVICAL WITH BILATERAL SALPINGECTOMY (Abdomen)  Patient Location: PACU  Anesthesia Type:General  Level of Consciousness: drowsy and patient cooperative  Airway & Oxygen Therapy: Patient Spontanous Breathing and Patient connected to nasal cannula oxygen  Post-op Assessment: Report given to RN and Post -op Vital signs reviewed and stable  Post vital signs: Reviewed and stable  Last Vitals:  Vitals Value Taken Time  BP 149/89 12/16/21 0941  Temp 97.4 0941  Pulse 71 12/16/21 0942  Resp 16 12/16/21 0942  SpO2 100 % 12/16/21 0942  Vitals shown include unvalidated device data.  Last Pain:  Vitals:   12/16/21 0714  TempSrc: Oral  PainSc: 0-No pain      Patients Stated Pain Goal: 6 (27/80/04 4715)  Complications: No notable events documented.

## 2021-12-16 NOTE — Discharge Instructions (Addendum)
HOME INSTRUCTIONS Post Operative Pain Med Plan:  >Take gabapentin 300 mg three times per day, as prescribed for 3 days, try to space them evenly  >Take the oxycodone- 1 tablet, on a schedule, around the clock, every 6 hours(set your phone alarm) for the first 2 days, there may be times when you will need 2 tablets but taking them on a schedule will decrease this need  >You can also take Tylenol together with the oxycodone.  If the oxycodone seems "to strong" then just take Tylenol  >Oxycodone will cause constipation, please be sure to take a stool softener (Colace) twice daily while taking this pain medication and/or continue this medication until your bowel regimen returns to normal  >Take the Toradol every 8 hours for the first 2 days then the remainder to supplement the pain as needed  >After the toradol is gone switch to Ibuprofen '600mg'$  every 6 hours as needed  If possible try to take the Toradol or Ibuprofen with food to help avoid upsetting your stomach  >Use a heating pad as well as needed  >I have also sent a prescription for zofran (ondansetron) for nausea to take if needed over the first couple of days  >Be gentle with your diet the first few days, liquids and soft non spicy food, fruits are great  >Get up and move, no lifting or straining   Please note any unusual or excessive bleeding, pain, swelling. Mild dizziness or drowsiness are normal for about 24 hours after surgery.   Shower when comfortable  Restrictions: No driving for 24 hours or while taking pain medications.  Activity:  No heavy lifting (> 10 lbs), nothing in vagina (no tampons, douching, or intercourse) x 4 weeks; no tub baths for 4 weeks Vaginal spotting is expected but if your bleeding is heavy, period like,  please call the office   Incision: the bandaids will fall off when they are ready to; you may clean your incision with mild soap and water but do not rub or scrub the incision site.  You may  experience slight bloody drainage from your incision periodically.  This is normal.  If you experience a large amount of drainage or the incision opens, please call your physician who will likely direct you to the emergency department.  Diet:  You may return to your regular diet.  Do not eat large meals.  Eat small frequent meals throughout the day.  Continue to drink a good amount of water at least 6-8 glasses of water per day, hydration is very important for the healing process.  Pain Management: Follow instructions as above  Always take prescription pain medication with food.  Percocet may cause constipation, you may want to take a stool softener while taking this medication.  A prescription of colace has been sent in to take twice daily if needed while taking the oxycodone.  Be sure to drink plenty of fluids and increase your fiber to help with constipation.  Alcohol -- Avoid for 24 hours and while taking pain medications.  Nausea: Take sips of ginger ale or soda  Fever -- Call physician if temperature over 101 degrees  Follow up:  If you do not already have a follow up appointment scheduled, please call the office at [phone].  If you experience fever (a temperature greater than 100.4), pain unrelieved by pain medication, shortness of breath, swelling of a single leg, or any other symptoms which are concerning to you please the office immediately.

## 2021-12-16 NOTE — Anesthesia Procedure Notes (Signed)
Procedure Name: Intubation Date/Time: 12/16/2021 7:41 AM  Performed by: Vista Deck, CRNAPre-anesthesia Checklist: Patient identified, Patient being monitored, Timeout performed, Emergency Drugs available and Suction available Patient Re-evaluated:Patient Re-evaluated prior to induction Oxygen Delivery Method: Circle system utilized Preoxygenation: Pre-oxygenation with 100% oxygen Induction Type: IV induction Ventilation: Mask ventilation without difficulty Laryngoscope Size: Mac and 3 Grade View: Grade I Tube type: Oral Tube size: 7.0 mm Number of attempts: 1 Airway Equipment and Method: Stylet and Oral airway Placement Confirmation: ETT inserted through vocal cords under direct vision, positive ETCO2 and breath sounds checked- equal and bilateral Secured at: 22 cm Tube secured with: Tape Dental Injury: Teeth and Oropharynx as per pre-operative assessment

## 2021-12-16 NOTE — Progress Notes (Signed)
  Transition of Care [facility]) Screening Note   Patient Details  Name: Cristina Robertson Date of Birth: 1975-06-15   [facility] CM/SW Contact:    Iona Beard, [place] Phone Number: 12/16/2021, 10:40 AM    Transition of Care Department [facility]) has reviewed patient and no TOC needs have been identified at this time. We will continue to monitor patient advancement through interdisciplinary progression rounds. If new patient transition needs arise, please place a TOC consult.

## 2021-12-16 NOTE — Anesthesia Preprocedure Evaluation (Signed)
Anesthesia Evaluation  Patient identified by MRN, date of birth, ID band Patient awake    Reviewed: Allergy & Precautions, H&P , NPO status , Patient's Chart, lab work & pertinent test results, reviewed documented beta blocker date and time   Airway Mallampati: II  TM Distance: >3 FB Neck ROM: full    Dental no notable dental hx.    Pulmonary neg pulmonary ROS,    Pulmonary exam normal breath sounds clear to auscultation       Cardiovascular Exercise Tolerance: Good negative cardio ROS   Rhythm:regular Rate:Normal     Neuro/Psych negative neurological ROS  negative psych ROS   GI/Hepatic negative GI ROS, Neg liver ROS,   Endo/Other  negative endocrine ROS  Renal/GU negative Renal ROS  negative genitourinary   Musculoskeletal   Abdominal   Peds  Hematology  (+) Blood dyscrasia, anemia ,   Anesthesia Other Findings   Reproductive/Obstetrics negative OB ROS                             Anesthesia Physical Anesthesia Plan  ASA: 2  Anesthesia Plan: General and General ETT   Post-op Pain Management:    Induction:   PONV Risk Score and Plan: Ondansetron  Airway Management Planned:   Additional Equipment:   Intra-op Plan:   Post-operative Plan:   Informed Consent: I have reviewed the patients History and Physical, chart, labs and discussed the procedure including the risks, benefits and alternatives for the proposed anesthesia with the patient or authorized representative who has indicated his/her understanding and acceptance.     Dental Advisory Given  Plan Discussed with: CRNA  Anesthesia Plan Comments:         Anesthesia Quick Evaluation

## 2021-12-16 NOTE — Op Note (Addendum)
Preoperative diagnosis:  1) Uterine fibroids 2) pelvic pain                                          Postoperative diagnosis:  same as above  Procedure:  Total abdominal hysterectomy, bilateral salpingectomy  Surgeon:  Dr. Janyth Pupa  Assistant:  Dr. Darlis Loan  Anesthesia:  General endotracheal  IVF: 1000cc UOP: 500cc EBL: 100cc  Intraoperative findings: 1) 12cm exophytic anterior uterine fibroid along with multiple small fibroids within uterus. Normal tubes and ovaries bilaterally.  Description of operation:    Patient was taken to the operating room and placed in the supine position where she underwent general endotracheal anesthesia.  She was then prepped and draped in the usual sterile fashion and a Foley catheter was placed for continuous bladder drainage.  A 6 cm transverse skin incision was made 2 fingerbreadth above the pubis and carried down sharply to the rectus fascia which was scored in the midline and extended laterally.  The fascia was taken off the muscles superiorly and inferiorly without difficulty.  The muscles were divided.  The peritoneal cavity was entered. A small Alexis self retaining retractor was placed. The upper abdomen was packed with wet lap pads.  Findings as noted above.  Due to the large size of the pedunculated fibroid, it could not be delivered through the incision.  The harmonic was used to separate the fibroid from the uterus and was tagged with a stitch.  Attention was then turned to the uterus. Both uterine cornu were grasped with Kocker clamps.  The left round ligament was suture ligated and cut with the electrocautery unit. An avascular window was made in the right utero ovarian ligament broad ligament.  The pedicle was suture ligated and cut.  The left fallopian tube was grasped with a babcock and using a Kelly clamp was cut, clamped and suture ligated. The same sequence of steps/procedure was performed on the right side as well including removal of  the right fallopian tube.  Serial pedicles were taken medial to the utero ovarian pedicle.  Each pedicle was clamped cut and transfixion suture ligated.   The uterine vessels were skeletonized bilaterally.  The uterine vessels were clamped bilaterally,  then cut and suture ligated. Several  more pedicles were taken down the cervix medial to the uterine vessels.  The uterus was then removed using the harmonic and sent to pathology.  The cervical stump was then over sewn with 0-vicryl.  Hemostasis was noted.  Attention was turned to the large uterine fibroid, which was wedged out in pieces to decompress and remove.    The pelvis was irrigated vigorously and all pedicles were examined and found to be hemostatic.  Surgicel  was used for additional hemostasis.  The muscles and peritoneum were reapproximated loosely.  The fascia was closed with 0 Vicryl running. Zyrnrelief was injected into the subcutaneous tissue for post operative pain management.   The skin was closed using 4-0 Monocryl in a subcuticular fashion.  Dermabond was then applied for additional wound integrity and to serve as a postoperative bacterial barrier.    The patient was awakened from anesthesia taken to the recovery room in good stable condition. All sponge instrument and needle counts were correct x 3.  The patient received Ancef and Toradol prophylactically preoperatively.  An experienced assistant was required given the standard of surgical care given  the complexity of the case.  This assistant was needed for exposure, dissection, suctioning, retraction, instrument exchange, and for overall help during the procedure.   Janyth Pupa, DO Attending [place], [facility] for [employer], [place]

## 2021-12-17 DIAGNOSIS — D251 Intramural leiomyoma of uterus: Secondary | ICD-10-CM | POA: Diagnosis not present

## 2021-12-17 LAB — CBC
HCT: 32.4 % — ABNORMAL LOW (ref 36.0–46.0)
Hemoglobin: 10.9 g/dL — ABNORMAL LOW (ref 12.0–15.0)
MCH: 30.5 pg (ref 26.0–34.0)
MCHC: 33.6 g/dL (ref 30.0–36.0)
MCV: 90.8 fL (ref 80.0–100.0)
Platelets: 173 10*3/uL (ref 150–400)
RBC: 3.57 MIL/uL — ABNORMAL LOW (ref 3.87–5.11)
RDW: 12.1 % (ref 11.5–15.5)
WBC: 7.2 10*3/uL (ref 4.0–10.5)
nRBC: 0 % (ref 0.0–0.2)

## 2021-12-17 LAB — BASIC METABOLIC PANEL
Anion gap: 5 (ref 5–15)
BUN: 7 mg/dL (ref 6–20)
CO2: 24 mmol/L (ref 22–32)
Calcium: 8.1 mg/dL — ABNORMAL LOW (ref 8.9–10.3)
Chloride: 107 mmol/L (ref 98–111)
Creatinine, Ser: 0.8 mg/dL (ref 0.44–1.00)
GFR, Estimated: >60 mL/min (ref >60)
Glucose, Bld: 125 mg/dL — ABNORMAL HIGH (ref 70–99)
Potassium: 3.4 mmol/L — ABNORMAL LOW (ref 3.5–5.1)
Sodium: 136 mmol/L (ref 135–145)

## 2021-12-17 MED ORDER — KETOROLAC TROMETHAMINE 10 MG PO TABS: 10.0000 mg | ORAL_TABLET | Freq: Three times a day (TID) | ORAL | 0 refills | Status: AC

## 2021-12-17 MED ORDER — IBUPROFEN 600 MG PO TABS: 600.0000 mg | ORAL_TABLET | Freq: Four times a day (QID) | ORAL | 0 refills | Status: AC | PRN

## 2021-12-17 MED ORDER — GABAPENTIN 300 MG PO CAPS: 300.0000 mg | ORAL_CAPSULE | Freq: Three times a day (TID) | ORAL | 0 refills | Status: DC

## 2021-12-17 MED ORDER — OXYCODONE HCL 5 MG PO TABS: 5.0000 mg | ORAL_TABLET | Freq: Four times a day (QID) | ORAL | 0 refills | Status: AC | PRN

## 2021-12-17 MED ORDER — ONDANSETRON HCL 4 MG PO TABS: 4.0000 mg | ORAL_TABLET | Freq: Four times a day (QID) | ORAL | 0 refills | Status: AC | PRN

## 2021-12-17 MED ORDER — DOCUSATE SODIUM 100 MG PO CAPS: 100.0000 mg | ORAL_CAPSULE | Freq: Two times a day (BID) | ORAL | 0 refills | Status: AC

## 2021-12-17 NOTE — Discharge Summary (Signed)
Physician Discharge Summary  Patient ID: Cristina Robertson MRN: [id number] DOB/AGE: 1975/06/02 46 y.o.  Admit date: 12/16/2021 Discharge date: 12/17/2021  Admission Diagnoses: Uterine fibroids, Pelvic pain  Discharge Diagnoses:  Principal Problem:   Abnormal uterine bleeding Active Problems:   Uterine fibroid   Pelvic pain   Discharged Condition: stable  Hospital Course: 46yo G0 who presented for scheduled TAH, BS due to large uterine fibroids.  See operative noted regarding surgery. Her postop course was uncomplicated and she was discharged home in stable condition on POD#1  Consults: None  Significant Diagnostic Studies: labs:  Results for orders placed or performed during the hospital encounter of 12/16/21 (from the past 24 hour(s))  CBC     Status: Abnormal   Collection Time: 12/17/21  3:47 AM  Result Value Ref Range   WBC 7.2 4.0 - 10.5 K/uL   RBC 3.57 (L) 3.87 - 5.11 MIL/uL   Hemoglobin 10.9 (L) 12.0 - 15.0 g/dL   HCT 32.4 (L) 36.0 - 46.0 %   MCV 90.8 80.0 - 100.0 fL   MCH 30.5 26.0 - 34.0 pg   MCHC 33.6 30.0 - 36.0 g/dL   RDW 12.1 11.5 - 15.5 %   Platelets 173 150 - 400 K/uL   nRBC 0.0 0.0 - 0.2 %  Basic metabolic panel     Status: Abnormal   Collection Time: 12/17/21  3:47 AM  Result Value Ref Range   Sodium 136 135 - 145 mmol/L   Potassium 3.4 (L) 3.5 - 5.1 mmol/L   Chloride 107 98 - 111 mmol/L   CO2 24 22 - 32 mmol/L   Glucose, Bld 125 (H) 70 - 99 mg/dL   BUN 7 6 - 20 mg/dL   Creatinine, Ser 0.80 0.44 - 1.00 mg/dL   Calcium 8.1 (L) 8.9 - 10.3 mg/dL   GFR, Estimated >60 >60 mL/min   Anion gap 5 5 - 15    Treatments: IV hydration, antibiotics: Ancef, analgesia: Toradol, oxycodone, and surgery: Total abdominal hysterectomy and bilateral salpingoophorectomy  Discharge Exam: Blood pressure 121/80, pulse 93, temperature (!) 97.4 F (36.3 C), resp. rate 16, height 5' 10.5" (1.791 m), weight 83.8 kg, SpO2 99 %. General appearance: alert, cooperative, and no  distress Resp: clear to auscultation bilaterally Cardio: regular rate and rhythm GI: soft, appropriately tender, no rebound, no guarding, +BS Incision: honeycomb in place- clean and dry Extremities: no edema, no calf tenderness Skin: warm and dry  Disposition: Discharge disposition: 01-Home or Self Care        Allergies as of 12/17/2021       Reactions   Contrast Media [iodinated Contrast Media] Other (See Comments)   She stated that last time she had IV contrast in the [place] she states that it caused "convulsions" and LOC.  She states that when she woke up in the ED she was unable to move for the rest of the day, but the next day she felt "fine".   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   Fusion Plus Caps Take 1 capsule by mouth every other  day.   gabapentin 300 MG capsule Commonly known as: Neurontin Take 1 capsule (300 mg total) by mouth 3 (three) times daily for 3 days.   ibuprofen 600 MG tablet Commonly known as: ADVIL Take 1 tablet (600 mg total) by mouth every 6 (six) hours as needed.   ketorolac 10 MG tablet Commonly known as: TORADOL Take 1 tablet (10 mg total) by mouth every 8 (eight) hours for 2 days.   levothyroxine 50 MCG tablet Commonly known as: SYNTHROID Take 50 mcg by mouth daily before breakfast.   niacin 100 MG tablet Commonly known as: VITAMIN B3 Take 100 mg by mouth at bedtime.   ondansetron 4 MG tablet Commonly known as: ZOFRAN Take 1 tablet (4 mg total) by mouth every 6 (six) hours as needed for nausea.   OVER THE COUNTER MEDICATION Take 1 tablet by mouth daily.  Vitamin A, D & K   oxyCODONE 5 MG immediate release tablet Commonly known as: Oxy IR/ROXICODONE Take 1 tablet (5 mg total) by mouth every 6 (six) hours as needed for up to 7 days for moderate pain.        Follow-up Information     Janyth Pupa, DO Follow up.   Specialty: Obstetrics and Gynecology Why: Follow up in 1-2 weeks as scheduled Contact information: [place] [address] [phone]                 Signed: Annalee Genta 12/17/2021, 7:27 AM

## 2021-12-18 LAB — SURGICAL PATHOLOGY

## 2021-12-23 ENCOUNTER — Encounter (HOSPITAL_COMMUNITY): Payer: Self-pay | Admitting: Obstetrics & Gynecology

## 2021-12-24 ENCOUNTER — Ambulatory Visit (INDEPENDENT_AMBULATORY_CARE_PROVIDER_SITE_OTHER): Payer: 59 | Admitting: Obstetrics & Gynecology

## 2021-12-24 ENCOUNTER — Encounter: Payer: Self-pay | Admitting: Obstetrics & Gynecology

## 2021-12-24 VITALS — BP 113/74 | HR 77 | Ht 70.5 in | Wt 176.0 lb

## 2021-12-24 DIAGNOSIS — Z4889 Encounter for other specified surgical aftercare: Secondary | ICD-10-CM

## 2021-12-24 MED ORDER — GABAPENTIN 300 MG PO CAPS: 300.0000 mg | ORAL_CAPSULE | Freq: Three times a day (TID) | ORAL | 0 refills | Status: AC

## 2021-12-24 NOTE — Progress Notes (Signed)
    PostOp Visit Note  AHSLEY Robertson is a 46 y.o. G62P0010 female who presents for a postoperative visit. She is 1 week postop following a supracervical hyst, bilateral salpingectomy completed on 9/19   Today she notes that she is doing well. Pain management best controlled with tylenol and gabapentin Denies fever or chills.  Tolerating gen diet.  +Flatus, Regular BMs.  Overall doing well and reports no acute complaints   Review of Systems Pertinent items are noted in HPI.    Objective:  BP 113/74 (BP Location: Right Arm, Patient Position: Sitting, Cuff Size: Normal)   Pulse 77   Ht 5' 10.5" (1.791 m)   Wt 176 lb (79.8 kg)   LMP 10/29/2021 (Approximate)   BMI 24.90 kg/m    Physical Examination:  GENERAL ASSESSMENT: well developed and well nourished SKIN: warm and dry CHEST: normal air exchange, respiratory effort normal with no retractions HEART: regular rate and rhythm ABDOMEN: soft, non-distended, +BS INCISION: C/D/I with dermabond EXTREMITY: no edema, no calf tenderness bilaterally PSYCH: mood appropriate, normal affect       Assessment:    Postop visit   Plan:   -meeting milestones appropriately -refill of gabapentin sent in -reviewed postop activity level -f/u in 5wks  Janyth Pupa, DO Attending [place], [place] for [employer], [place]

## 2022-01-28 ENCOUNTER — Encounter: Payer: Self-pay | Admitting: Obstetrics & Gynecology

## 2022-01-28 ENCOUNTER — Other Ambulatory Visit (HOSPITAL_COMMUNITY)
Admission: RE | Admit: 2022-01-28 | Discharge: 2022-01-28 | Disposition: A | Discharge status: Home / Self Care | Payer: 59 | Source: Ambulatory Visit | Attending: Obstetrics & Gynecology | Admitting: Obstetrics & Gynecology

## 2022-01-28 ENCOUNTER — Ambulatory Visit (INDEPENDENT_AMBULATORY_CARE_PROVIDER_SITE_OTHER): Payer: 59 | Admitting: Obstetrics & Gynecology

## 2022-01-28 VITALS — BP 112/67 | HR 94 | Ht 70.5 in | Wt 176.6 lb

## 2022-01-28 DIAGNOSIS — Z4889 Encounter for other specified surgical aftercare: Secondary | ICD-10-CM

## 2022-01-28 DIAGNOSIS — N898 Other specified noninflammatory disorders of vagina: Secondary | ICD-10-CM

## 2022-01-28 NOTE — Progress Notes (Signed)
    PostOp Visit Note  Cristina Robertson is a 46 y.o. G75P0010 female who presents for a postoperative visit. She is 6 weeks postop following a TAH, BS completed on 9/19.  When feeling stressed, notes discomfort.  She also notes when she sits for a long time in a position will note discomfort.  On occasion will take ibuprofen. Denies fever or chills.  Tolerating gen diet.  +Flatus, Regular BMs.  About 1-2 wks ago noted faint spotting, but nothing since that time.  Brown discharge- denies itching or odor.    Overall doing well and reports no acute complaints   Review of Systems Pertinent items are noted in HPI.    Objective:  BP 112/67 (BP Location: Right Arm, Patient Position: Sitting, Cuff Size: Normal)   Pulse 94   Ht 5' 10.5" (1.791 m)   Wt 176 lb 9.6 oz (80.1 kg)   LMP 10/29/2021 (Approximate)   BMI 24.98 kg/m    Physical Examination:  GENERAL ASSESSMENT: well developed and well nourished SKIN: normal color, no lesions CHEST: normal air exchange, respiratory effort normal with no retractions HEART: regular rate and rhythm ABDOMEN: soft, non-distended, no rebound, no guarding INCISION: C/D/I- well healed GU: minimal yellow-white discharge, cervix visualized.  No bleeding seen.  On bimanual exam no tenderness or masses appreciated EXTREMITY: no edema, no calf tenderness bilaterally PSYCH: mood appropriate, normal affect       Assessment:    S/p Supracervical hysterectomy Postop visit Vaginal discharge  Plan:   -meeting milestones appropriately, may return to regular activity -questions regarding exercise, weight lifting, etc were answered -vaginal discharge- will r/o underlying infection, further management pending results of vaginitis panel -f/u prn or 69yrfor annual  JJanyth Pupa DO Attending [place] [facility] [facility] [place]

## 2022-01-28 NOTE — Addendum Note (Signed)
Addended by: Janece Canterbury on: 01/28/2022 01:41 PM   Modules accepted: Orders

## 2022-01-30 LAB — CERVICOVAGINAL ANCILLARY ONLY
Bacterial Vaginitis (gardnerella): NEGATIVE
Candida Glabrata: NEGATIVE
Candida Vaginitis: NEGATIVE
Comment: NEGATIVE
Comment: NEGATIVE
Comment: NEGATIVE

## 2022-07-29 NOTE — Telephone Encounter (Signed)
Error

## 2022-08-04 IMAGING — US US PELVIS COMPLETE WITH TRANSVAGINAL
1 series · 13 of 25 positions shown · non-contrast
Comparison: None

CLINICAL DATA: Uterine fibroids, LMP 03/15/2020

EXAM:
TRANSABDOMINAL AND TRANSVAGINAL ULTRASOUND OF PELVIS
TECHNIQUE: Both transabdominal and transvaginal ultrasound examinations of the
pelvis were performed. Transabdominal technique was performed for
global imaging of the pelvis including uterus, ovaries, adnexal
regions, and pelvic cul-de-sac. It was necessary to proceed with
endovaginal exam following the transabdominal exam to visualize the
endometrium and ovaries.

[Series 1: us pelvic complete with transvaginal · 152 acquisitions, 13 frames shown]
[im 1/152]
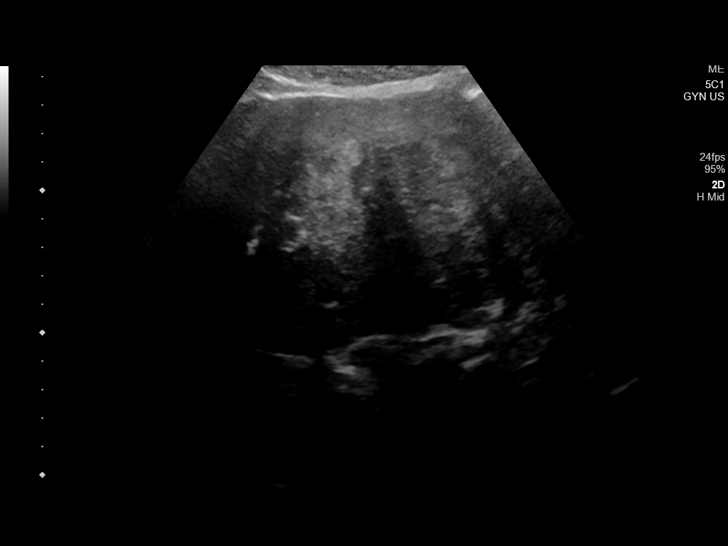
[im 13/152]
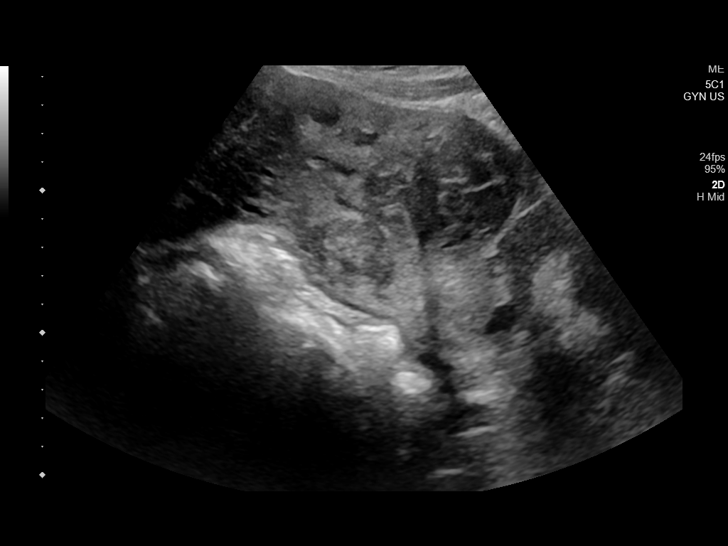
[im 26/152]
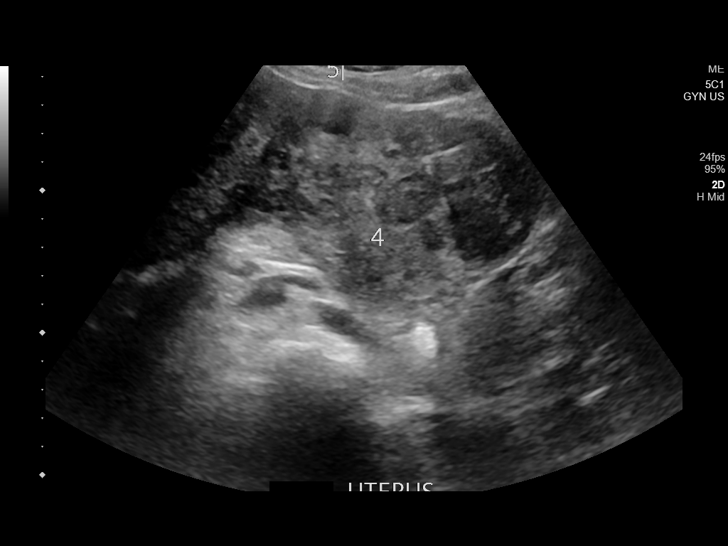
[im 38/152]
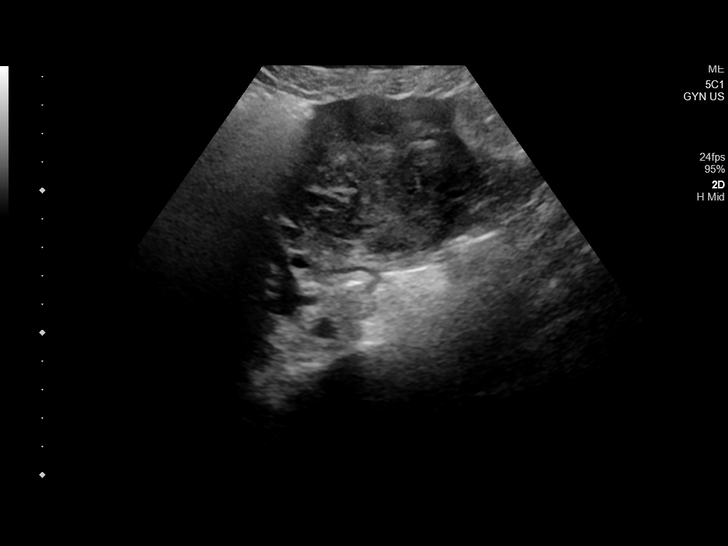
[im 51/152]
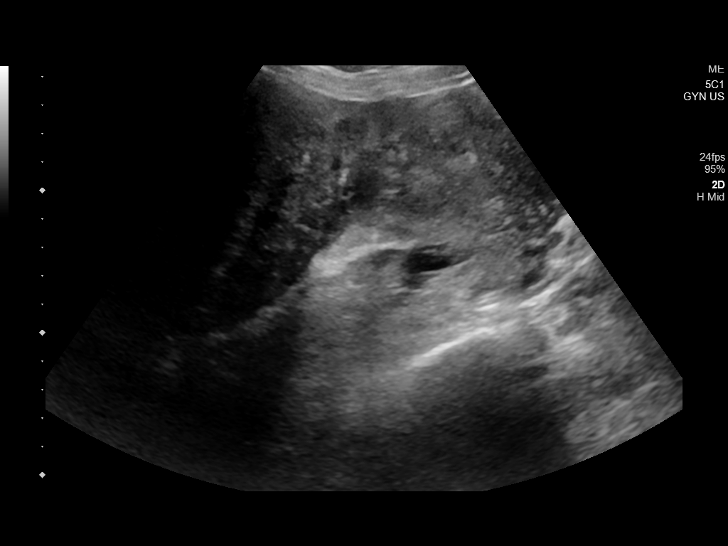
[im 63/152]
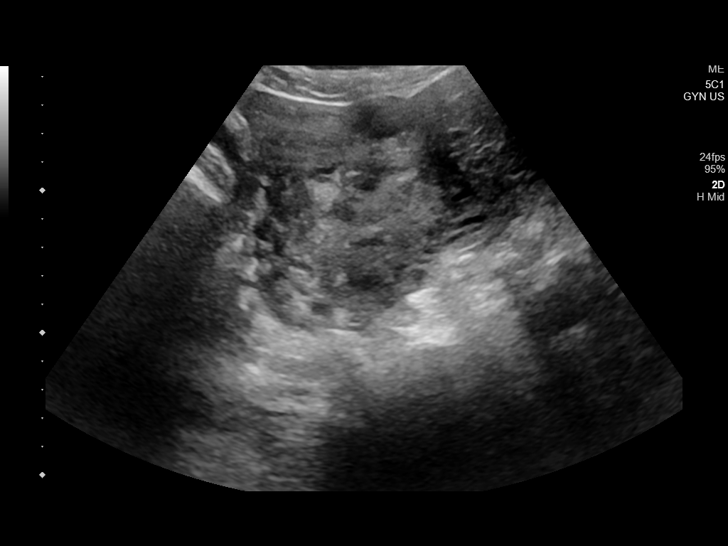
[im 76/152]
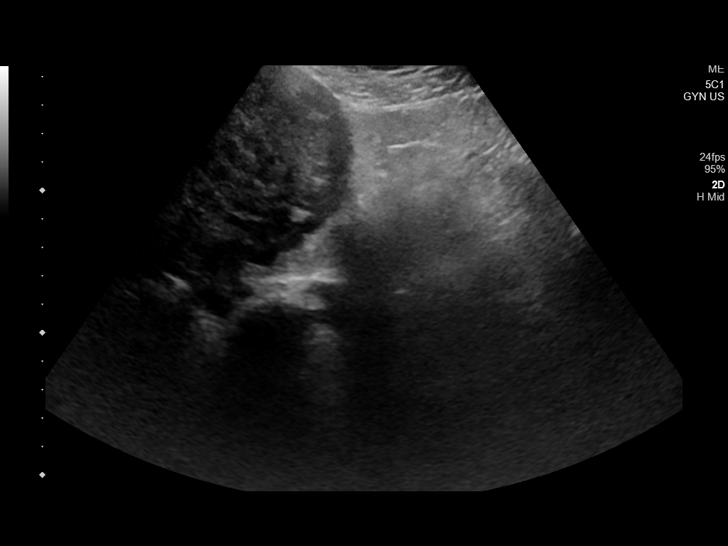
[im 89/152]
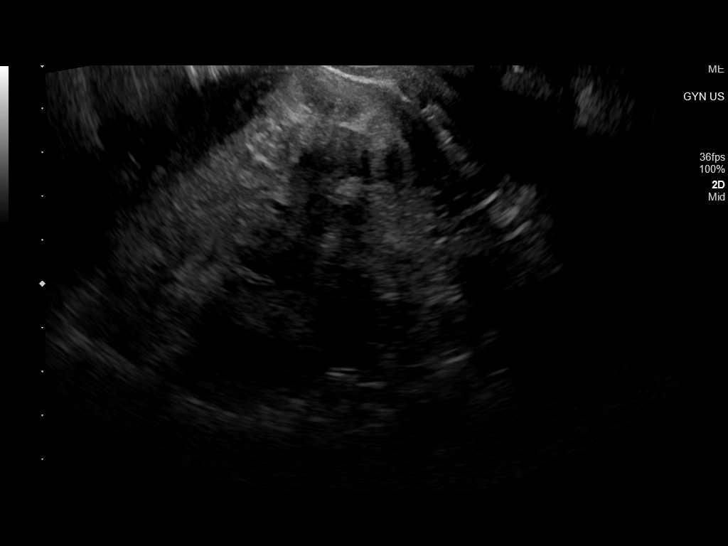
[im 101/152]
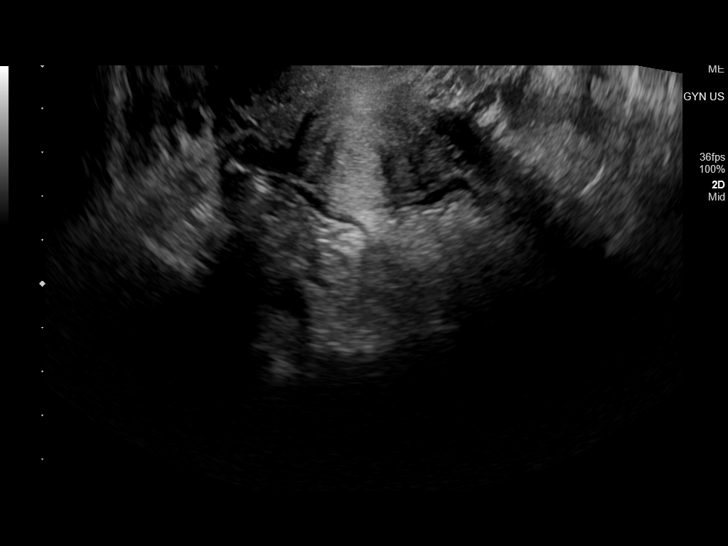
[im 114/152]
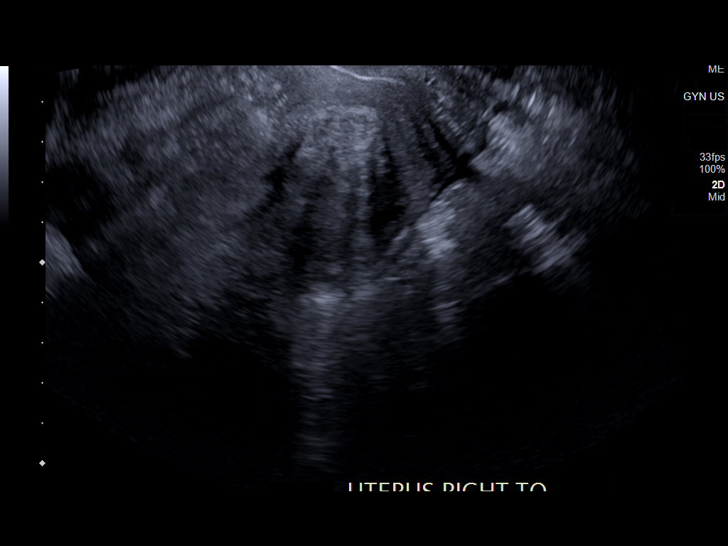
[im 126/152]
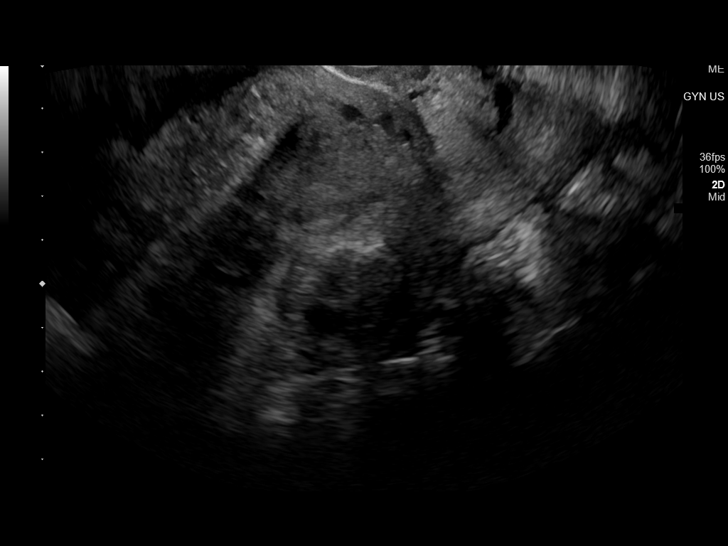
[im 139/152]
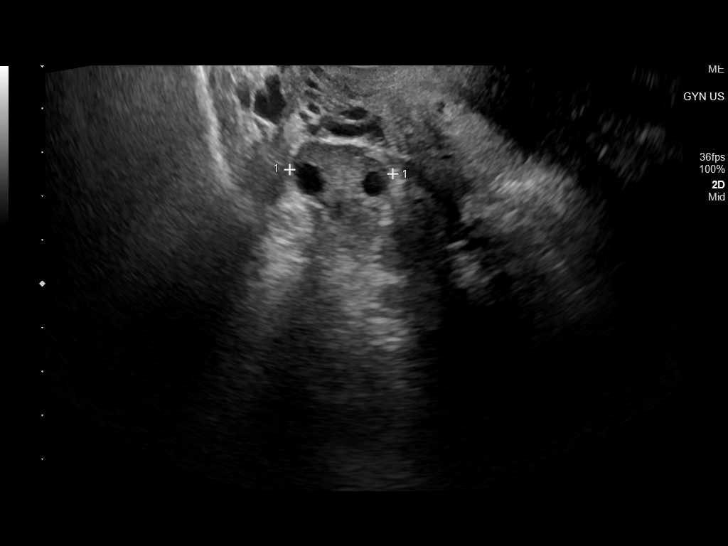
[im 152/152]
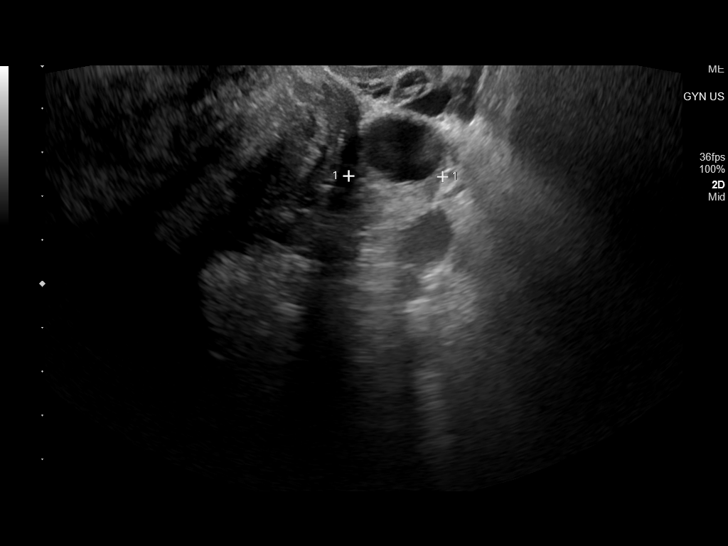

[13 of 25 positions shown; findings below may reference images not displayed]

FINDINGS: Uterus

Measurements: 16.4 x 9.0 x 10.5 cm = volume: 621 mL. Anteverted.
Enlarged lobular uterus containing multiple masses consistent with
uterine leiomyomata. Largest of these measure 12.5 x 9.1 x 10.5 cm
at uterine fundus. Smaller discrete leiomyomata anteriorly measure
5.1 cm and 4.1 cm in greatest sizes. Remainder of uterus is
heterogeneous.

Endometrium

Thickness: 7 mm.  No endometrial fluid

Right ovary

Measurements: 3.6 x 2.6 x 2.4 cm = volume: 11.7 mL. Normal
morphology without mass

Left ovary

Measurements: 3.6 x 2.0 x 2.1 cm = volume: 7.9 mL. Normal morphology
without mass

Other findings

No free pelvic fluid.  No adnexal masses.
IMPRESSION: Enlarged uterus containing multiple leiomyomata, largest 12.5 cm
diameter at uterine fundus.

Unremarkable endometrial complex and ovaries.
